# Patient Record
Sex: Female | Born: 1965
Health system: Southern US, Community
[De-identification: ages and names within clinical notes are randomized; demographics above are authoritative.]

## PROBLEM LIST (undated history)

## (undated) DIAGNOSIS — R350 Frequency of micturition: Secondary | ICD-10-CM

## (undated) DIAGNOSIS — N390 Urinary tract infection, site not specified: Secondary | ICD-10-CM

## (undated) DIAGNOSIS — R87629 Unspecified abnormal cytological findings in specimens from vagina: Secondary | ICD-10-CM

## (undated) DIAGNOSIS — J069 Acute upper respiratory infection, unspecified: Secondary | ICD-10-CM

## (undated) DIAGNOSIS — R319 Hematuria, unspecified: Secondary | ICD-10-CM

## (undated) HISTORY — DX: Unspecified abnormal cytological findings in specimens from vagina: R87.629

## (undated) HISTORY — PX: CERVIX LESION DESTRUCTION: SHX591

## (undated) HISTORY — PX: OTHER SURGICAL HISTORY: SHX169

## (undated) HISTORY — DX: Hematuria, unspecified: R31.9

## (undated) HISTORY — PX: TONSILLECTOMY: SUR1361

## (undated) HISTORY — DX: Frequency of micturition: R35.0

## (undated) HISTORY — DX: Acute upper respiratory infection, unspecified: J06.9

## (undated) HISTORY — DX: Urinary tract infection, site not specified: N39.0

---
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
## 2001-03-11 ENCOUNTER — Observation Stay (HOSPITAL_COMMUNITY): Admission: EM | Admit: 2001-03-11 | Discharge: 2001-03-12 | Payer: Self-pay | Admitting: Emergency Medicine

## 2003-01-25 HISTORY — PX: THORACOTOMY: SUR1349

## 2003-03-10 ENCOUNTER — Other Ambulatory Visit: Admission: RE | Admit: 2003-03-10 | Discharge: 2003-03-10 | Payer: Self-pay | Admitting: Obstetrics and Gynecology

## 2003-05-01 ENCOUNTER — Ambulatory Visit (HOSPITAL_COMMUNITY): Admission: RE | Admit: 2003-05-01 | Discharge: 2003-05-01 | Payer: Self-pay | Admitting: Family Medicine

## 2003-05-12 ENCOUNTER — Ambulatory Visit (HOSPITAL_COMMUNITY): Admission: RE | Admit: 2003-05-12 | Discharge: 2003-05-12 | Payer: Self-pay | Admitting: Family Medicine

## 2003-06-06 ENCOUNTER — Ambulatory Visit (HOSPITAL_COMMUNITY): Admission: RE | Admit: 2003-06-06 | Discharge: 2003-06-06 | Payer: Self-pay | Admitting: Family Medicine

## 2003-06-09 ENCOUNTER — Ambulatory Visit (HOSPITAL_COMMUNITY): Admission: RE | Admit: 2003-06-09 | Discharge: 2003-06-09 | Payer: Self-pay | Admitting: Family Medicine

## 2003-06-18 ENCOUNTER — Ambulatory Visit (HOSPITAL_COMMUNITY): Admission: RE | Admit: 2003-06-18 | Discharge: 2003-06-18 | Payer: Self-pay | Admitting: Family Medicine

## 2003-07-10 ENCOUNTER — Encounter (INDEPENDENT_AMBULATORY_CARE_PROVIDER_SITE_OTHER): Payer: Self-pay | Admitting: Specialist

## 2003-07-10 ENCOUNTER — Inpatient Hospital Stay (HOSPITAL_COMMUNITY): Admission: RE | Admit: 2003-07-10 | Discharge: 2003-07-14 | Payer: Self-pay | Admitting: Cardiothoracic Surgery

## 2003-07-31 ENCOUNTER — Encounter: Admission: RE | Admit: 2003-07-31 | Discharge: 2003-07-31 | Payer: Self-pay | Admitting: Cardiothoracic Surgery

## 2003-10-30 ENCOUNTER — Encounter: Admission: RE | Admit: 2003-10-30 | Discharge: 2003-10-30 | Payer: Self-pay | Admitting: Cardiothoracic Surgery

## 2004-08-06 ENCOUNTER — Ambulatory Visit (HOSPITAL_COMMUNITY): Admission: RE | Admit: 2004-08-06 | Discharge: 2004-08-06 | Payer: Self-pay | Admitting: Cardiothoracic Surgery

## 2006-01-10 ENCOUNTER — Ambulatory Visit (HOSPITAL_COMMUNITY): Admission: RE | Admit: 2006-01-10 | Discharge: 2006-01-10 | Payer: Self-pay | Admitting: Obstetrics & Gynecology

## 2006-03-07 ENCOUNTER — Ambulatory Visit (HOSPITAL_COMMUNITY): Admission: RE | Admit: 2006-03-07 | Discharge: 2006-03-07 | Payer: Self-pay | Admitting: Neurosurgery

## 2007-03-06 ENCOUNTER — Other Ambulatory Visit: Admission: RE | Admit: 2007-03-06 | Discharge: 2007-03-06 | Payer: Self-pay | Admitting: Obstetrics & Gynecology

## 2007-05-28 ENCOUNTER — Ambulatory Visit (HOSPITAL_COMMUNITY): Admission: RE | Admit: 2007-05-28 | Discharge: 2007-05-28 | Payer: Self-pay | Admitting: Obstetrics & Gynecology

## 2008-04-11 ENCOUNTER — Observation Stay (HOSPITAL_COMMUNITY): Admission: EM | Admit: 2008-04-11 | Discharge: 2008-04-11 | Payer: Self-pay | Admitting: Emergency Medicine

## 2008-07-07 ENCOUNTER — Other Ambulatory Visit: Admission: RE | Admit: 2008-07-07 | Discharge: 2008-07-07 | Payer: Self-pay | Admitting: Obstetrics & Gynecology

## 2009-07-16 ENCOUNTER — Other Ambulatory Visit: Admission: RE | Admit: 2009-07-16 | Discharge: 2009-07-16 | Payer: Self-pay | Admitting: Obstetrics & Gynecology

## 2009-09-08 ENCOUNTER — Ambulatory Visit (HOSPITAL_COMMUNITY): Admission: RE | Admit: 2009-09-08 | Discharge: 2009-09-08 | Payer: Self-pay | Admitting: Obstetrics & Gynecology

## 2010-05-06 LAB — DIFFERENTIAL
Basophils Absolute: 0 10*3/uL (ref 0.0–0.1)
Basophils Relative: 1 % (ref 0–1)
Eosinophils Absolute: 0 10*3/uL (ref 0.0–0.7)
Eosinophils Relative: 1 % (ref 0–5)
Lymphocytes Relative: 37 % (ref 12–46)
Lymphocytes Relative: 38 % (ref 12–46)
Lymphs Abs: 1.2 10*3/uL (ref 0.7–4.0)
Lymphs Abs: 1.6 10*3/uL (ref 0.7–4.0)
Monocytes Absolute: 0.4 10*3/uL (ref 0.1–1.0)
Monocytes Relative: 12 % (ref 3–12)
Neutro Abs: 1.6 10*3/uL — ABNORMAL LOW (ref 1.7–7.7)
Neutro Abs: 2.1 10*3/uL (ref 1.7–7.7)
Neutrophils Relative %: 48 % (ref 43–77)
Neutrophils Relative %: 50 % (ref 43–77)

## 2010-05-06 LAB — URINALYSIS, ROUTINE W REFLEX MICROSCOPIC
Glucose, UA: NEGATIVE mg/dL
Ketones, ur: NEGATIVE mg/dL
Leukocytes, UA: NEGATIVE
Nitrite: NEGATIVE
Protein, ur: 30 mg/dL — AB
Specific Gravity, Urine: >1.03 — ABNORMAL HIGH (ref 1.005–1.030)
Urobilinogen, UA: 0.2 mg/dL (ref 0.0–1.0)
pH: 5 (ref 5.0–8.0)

## 2010-05-06 LAB — COMPREHENSIVE METABOLIC PANEL
CO2: 17 mEq/L — ABNORMAL LOW (ref 19–32)
Calcium: 7.1 mg/dL — ABNORMAL LOW (ref 8.4–10.5)
Creatinine, Ser: 0.98 mg/dL (ref 0.4–1.2)
GFR calc non Af Amer: >60 mL/min (ref >60)
Glucose, Bld: 100 mg/dL — ABNORMAL HIGH (ref 70–99)

## 2010-05-06 LAB — CBC
HCT: 54.1 % — ABNORMAL HIGH (ref 36.0–46.0)
Hemoglobin: 12.9 g/dL (ref 12.0–15.0)
Hemoglobin: 18.7 g/dL — ABNORMAL HIGH (ref 12.0–15.0)
MCHC: 34.5 g/dL (ref 30.0–36.0)
MCHC: 35 g/dL (ref 30.0–36.0)
MCV: 92.8 fL (ref 78.0–100.0)
MCV: 93.1 fL (ref 78.0–100.0)
Platelets: 207 10*3/uL (ref 150–400)
RBC: 3.95 MIL/uL (ref 3.87–5.11)
RBC: 5.83 MIL/uL — ABNORMAL HIGH (ref 3.87–5.11)
RDW: 12.3 % (ref 11.5–15.5)
RDW: 12.6 % (ref 11.5–15.5)
WBC: 3.3 10*3/uL — ABNORMAL LOW (ref 4.0–10.5)

## 2010-05-06 LAB — STOOL CULTURE

## 2010-05-06 LAB — BASIC METABOLIC PANEL
BUN: 11 mg/dL (ref 6–23)
BUN: 2 mg/dL — ABNORMAL LOW (ref 6–23)
CO2: 19 mEq/L (ref 19–32)
Calcium: 10.3 mg/dL (ref 8.4–10.5)
Chloride: 110 mEq/L (ref 96–112)
Creatinine, Ser: 1.19 mg/dL (ref 0.4–1.2)
GFR calc Af Amer: >60 mL/min (ref >60)
GFR calc Af Amer: >60 mL/min (ref >60)
GFR calc non Af Amer: 50 mL/min — ABNORMAL LOW (ref >60)
GFR calc non Af Amer: >60 mL/min (ref >60)
Glucose, Bld: 133 mg/dL — ABNORMAL HIGH (ref 70–99)
Potassium: 3.3 mEq/L — ABNORMAL LOW (ref 3.5–5.1)
Potassium: 3.5 mEq/L (ref 3.5–5.1)
Sodium: 141 mEq/L (ref 135–145)
Sodium: 141 mEq/L (ref 135–145)

## 2010-05-06 LAB — PHOSPHORUS: Phosphorus: 2.1 mg/dL — ABNORMAL LOW (ref 2.3–4.6)

## 2010-05-06 LAB — LIPASE, BLOOD: Lipase: 30 U/L (ref 11–59)

## 2010-05-06 LAB — MAGNESIUM: Magnesium: 1.6 mg/dL (ref 1.5–2.5)

## 2010-05-06 LAB — HEPATIC FUNCTION PANEL
ALT: 27 U/L (ref 0–35)
Albumin: 4.7 g/dL (ref 3.5–5.2)
Indirect Bilirubin: 0.4 mg/dL (ref 0.3–0.9)
Total Protein: 8.5 g/dL — ABNORMAL HIGH (ref 6.0–8.3)

## 2010-05-06 LAB — URINE MICROSCOPIC-ADD ON

## 2010-05-06 LAB — PREGNANCY, URINE: Preg Test, Ur: NEGATIVE

## 2010-05-06 LAB — OVA AND PARASITE EXAMINATION

## 2010-06-08 NOTE — H&P (Signed)
NAMEMarland Kitchen  KALIYAN, OSBOURN NO.:  [id number]   MEDICAL RECORD NO.:  [id number]          PATIENT TYPE:  EMS   LOCATION:  ED                            FACILITY:  [facility]   PHYSICIAN:  Dorris Singh, DO    DATE OF BIRTH:  09-21-1965   DATE OF ADMISSION:  04/10/2008  DATE OF DISCHARGE:  LH                              HISTORY & PHYSICAL   The patient is a 45 year old Caucasian female who presented to the [facility] with 4 days of diarrhea and nausea and vomiting.  She stated that on Sunday she went out with her parents, did not eat  anything unusual, but feels that she got a virus from that outing.  When  she came home on Monday she had profuse diarrhea and had several bowel  movements during the day.  The stool is now just water as it is  currently.  She says for 4 days she has not been able to hydrate herself  and she is losing more fluids than she is intaking.  She came here to be  evaluated.  She says that she is feeling weak, and has weakness with  exertion, and shortness of breath with exertion as well.  Her stools are  characterized as watery and the symptoms are moderate, is aggravated by  nothing and is relieved by nothing.  She has not been able to eat and  she also feels that her bowels are hyperactive.  The patient denies  being around any sick contacts.  Her primary care physician is Dr.  Milinda Cave.   ALLERGIES:  No known drug allergies.   She is currently on __________ PSE oral for contraception.   PAST SURGICAL HISTORY:  She has had a thoracotomy for a cystic rib that  she had removed.   PAST MEDICAL HISTORY:  She denies any issue.   SOCIAL HISTORY:  She is nonsmoker, nondrinker.  She is not married but  she currently is a Engineer, civil (consulting).   REVIEW OF SYSTEMS:  CONSTITUTIONAL:  Positive for appetite change.  EYES:  Negative for changes in vision.  EARS, NOSE, MOUTH AND THROAT:  Negative for sore throat or changes in hearing.  CARDIOVASCULAR:  Negative for palpitations.  Positive for dyspnea on exertion.  RESPIRATORY:  Negative for cough and wheezing, positive for dyspnea on  exertion.  GASTROINTESTINAL:  Positive for nausea, vomiting, diarrhea,  abdominal pain and cramps,  no blood in stool.  GU:  Negative for  dysuria, urgency.  MUSCULOSKELETAL:  Negative for arthralgias or  myalgias.  SKIN:  Negative for rash.  NEURO:  Negative for headache or  confusion.   PHYSICAL EXAMINATION:  Blood pressure is 148/76, pulse rate is 99,  respirations are 28.  GENERAL:  The patient is a 45 year old woman who is well-developed, well-  nourished, in no acute distress.  HEAD:  Normocephalic, atraumatic.  Eyes are PERRLA.  EOMI.  Teeth are in  fair repair.  Nose:  Turbinates are moist.  NECK:  Supple.  No lymphadenopathy.  HEART:  Tachy sinus.  No murmurs noted.  LUNGS:  Clear to auscultation bilaterally.  ABDOMEN:  Soft, nontender, with hyperactive bowel sounds in all 4  quadrants EXTREMITIES:  Positive pulses.  No edema, ecchymosis or  cyanosis.  NEURO:  Cranial nerves II-XII are grossly intact.  She is A and O x3.  SKIN:  Positive tenting and dry, mucous membranes are moist.   LABORATORIES:  Urine pregnancy is negative.  Her UA shows few bacteria,  negative for any casting, negative for urine nitrates or leukocyte  esterase.  Her lipase is 30.  Her hepatic function panel is within  normal limits except for total protein being 8.5.  Two-view chest shows  chronic emphysematous change without acute infiltrate.  Sodium is 141,  potassium 3.3, chloride 110, carbon dioxide 19, glucose 133, BUN 11,  creatinine 1.19.  White count 3.3, hemoglobin 18.7, hematocrit 54.1 and  a platelet count of 207.   ASSESSMENT AND PLAN:  1. Acute gastroenteritis.  2. Diarrhea.  3. Dehydration.  4. Shortness of breath.   PLAN:  Will be to admit the patient to the service of [facility] and  observation.  Will put a bedside commode for her and do strict I's  and  O's as well as keeping her saturations above 90% using nasal cannula,  Venti non-rebreather.  Also will place her on a clear liquid diet and  increase as tolerated to a full liquid and then regular.  Will start her  on normal saline at 250 mL/hour with 2 fluid boluses in between for this  evening.  Will also get stool cultures and check for ova and parasites,  and will also monitor her blood work in the morning.  Will place her on  DVT and GI prophylaxis.  Will have her take her birth control pills from  home.  We will also put her on nebulizer p.r.n. since we did see some  emphysema changes on her chest x-ray, and she is complaining of  shortness of breath.  At this point in time, I do not feel that we need  to consult GI, but if patient continues to have these symptoms in spite  of some of the palliative measures we have instituted, we will consider  a GI consult if needed.  Will continue to monitor and change therapy as  necessary.      Dorris Singh, DO  Electronically Signed     CB/MEDQ  D:  04/11/2008  T:  04/11/2008  Job:  [id number]

## 2010-06-08 NOTE — Discharge Summary (Signed)
[name], [name]                  ACCOUNT NO.:  [id number]   MEDICAL RECORD NO.:  [id number]          PATIENT TYPE:  OBV   LOCATION:  A311                          FACILITY:  [facility]   PHYSICIAN:  Osvaldo Shipper, MD     DATE OF BIRTH:  27-Jan-1965   DATE OF ADMISSION:  DATE OF DISCHARGE:  03/19/2010LH                               DISCHARGE SUMMARY   The patient's PMD is Dr. Milinda Cave.  Please review H and P dictated by Dr.  Elige Radon for details regarding the patient's presenting illness.   DISCHARGE DIAGNOSES:  1. Acute gastroenteritis, improved.  2. Hypokalemia, improved.   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  This  was followed by diarrhea.  Denied any abdominal pain.  She presented to  the ED where she underwent a chest x-ray, which was negative for any  acute infiltrate.  The patient underwent blood work, which revealed some  mild degree of hemoconcentration and she was also noted to be  hypokalemic.  The patient was managed in the ED but did not improve and  so was admitted for further evaluation and management.  The patient was  admitted to the hospital and was started on IV fluids.  Potassium was  replaced.  Imodium was given to the patient and the patient quickly  improved, and this afternoon she was keen on going back home.  We  rechecked a potassium level and came back at 3.5.  Since her symptoms  have improved and since she is very keen on going back home, we felt it  was okay to discharge her   DISCHARGE MEDICATIONS:  1. Potassium chloride 40 mEq daily for 5 days.  2. Magnesium oxide 400 mg 3 times  daily for 5 days.  3. Imodium over the counter as needed for diarrhea, maximum of 4 pills      a day.   Follow up with PMD in 1-2 weeks.   Diet, full liquids initially and then advance diet as  tolerated.  Physical activity as before.   Total time on this encounter greater than 30 minutes.      Osvaldo Shipper, MD  Electronically Signed     GK/MEDQ  D:  04/11/2008  T:  04/12/2008  Job:  [id number]   cc:   Jeoffrey Massed, MD  Fax: [phone]

## 2010-06-11 NOTE — Op Note (Signed)
NAME:  Shelby [name], Shelby [name]                            ACCOUNT NO.:  [id number]   MEDICAL RECORD NO.:  [id number]                   PATIENT TYPE:  INP   LOCATION:  3309                                 FACILITY:  [facility]   PHYSICIAN:  Gwenith Daily. Tyrone Sage, M.D.            DATE OF BIRTH:  [birth year]   DATE OF PROCEDURE:  07/10/2003  DATE OF DISCHARGE:  07/14/2003                                 OPERATIVE REPORT   PREOPERATIVE DIAGNOSIS:  Right ninth rib lesion.   POSTOPERATIVE DIAGNOSIS:  Right ninth rib lesion.   PROCEDURE:  Right thoracotomy and resection of right ninth rib in  conjunction with neurosurgery.   SURGEON:  Gwenith Daily. Tyrone Sage, M.D.   ASSISTANT:  Eber Jones A. Eustaquio Boyden.   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  A chest x-ray  revealed a posterior mediastinal mass.  Further evaluation with CT scan  revealed a cystic appearing ninth posterior rib lesion extending into the  vertebral body but without spinal cord compression or involvement.  Resection of the rib is recommended by both me and neurosurgery service.  It  was planned to do the resection of the rib and have the assistance of  neurosurgery to resect the involved vertebral body.  The procedure was  discussed in detail with the patient who agreed and signed informed consent.   DESCRIPTION OF PROCEDURE:  With arterial line and double lumen endotracheal  in place, the patient was turned into the lateral decubitus position with  the right side up.  An incision was made in the seventh intercostal space  and the chest entered.  This gave good visualization of an enlarged ninth  rib which was pushing on the posterior ribs above and below it but without  direct invasion.  Distally, the ninth rib was divided and then the rib with  overlying soft tissue was rotated medially and dissected down to the  transverse process.  With this  process, the middle portion of the thinned  out bony rib that was cystic in nature, broke.  The remainder of the rib was  resected down to the transverse processes.  The remainder of the involvement  into the vertebral bodies was dictated under a separate note by  neurosurgery, Dr. Venetia Maxon.  With the portion of the vertebral body resected  after a bone graft had been placed, the intercostal sutures were placed and  the ribs reapproximated.  The chest wall was closed in layers with  interrupted 0 Vicryl sutures, 2-0 subcutaneous tissue and a 3-0 subcuticular  stitch were applied.  Dry dressings were applied.  A single 28 chest tube  was left in place.  The patient tolerated the procedure without obvious  complications, was extubated in the operating room with full lower extremity  function, and was transferred to the recovery room.  Gwenith Daily Tyrone Sage, M.D.    Tyson Babinski  D:  07/14/2003  T:  07/14/2003  Job:  [id number]

## 2010-06-11 NOTE — Discharge Summary (Signed)
NAMEMarland Kitchen  VYLET, MAFFIA NO.:  [id number]   MEDICAL RECORD NO.:  [id number]          PATIENT TYPE:  INP   LOCATION:  3309                         FACILITY:  [facility]   PHYSICIAN:  Sheliah Plane, MD    DATE OF BIRTH:  [birth year]   DATE OF ADMISSION:  07/10/2003  DATE OF DISCHARGE:  07/14/2003                                 DISCHARGE SUMMARY   ADMITTING DIAGNOSIS:  Right T9 rib lesion extending into the T9 vertebral  body.   PAST MEDICAL HISTORY AND DISCHARGE DIAGNOSES:  1.  Surgical wound closure of the perineum after injury secondary to fall in      February 2003.  2.  Tonsillectomy and adenoidectomy in 1969.  3.  Surgery for ptosis of the right eye in 1969.  4.  Right T9 rib lesion extending into the T9 vertebral body, status post      right thoracotomy and resection of calcific cystic thoracic mass at the      ninth rib with resection of the ninth rib portion and partial      vertebrectomy for full resection.   ALLERGIES:  No known drug allergies.   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  Secondary to an abnormality found on that chest x-ray, further  evaluation including a bone scan and MRI led to evaluation by Dr. Danae Orleans.  Venetia Maxon for an expanding lesion of the right 9th rib which extended into the  T9 vertebral body.  This possibly represented a giant cell tumor, or  aneurysmal bone cyst.  Bone scan was hot in the area of T9.  The patient's  only symptoms were minor back discomfort.  She denied any significant pain.  The patient initially saw Dr. Venetia Maxon and he then referred the patient to Dr.  Sheliah Plane in order to consult him to aid with the surgery.   HOSPITAL COURSE:  The patient was admitted and taken to the OR on July 10, 2003 for a right  thoracotomy and resection of calcific/cystic thoracic mass  at the 9th rib with resection of the 9th rib portion and partial  vertebrectomy for full resection.  Dr. Tyrone Sage and Dr. Venetia Maxon worked  together during the procedure.  The patient tolerated the procedure well and  was hemodynamically stable immediately postoperatively.  The patient was  transferred from the OR to the postanesthesia care unit and then  subsequently to unit 2300 without complication.  The patient was extubated  without complication and awoke from anesthesia neurologically intact.   The patient's postoperative course progressed as expected.  It was  uncomplicated and she remained afebrile with stable vital signs throughout  the postoperative course.  The patient's chest x-rays remained stable as  well.  The patient's invasive lines and On-Q were discontinued on  postoperative day 1 and postoperative day 2, accordingly.  The patient was  transferred from units 2300 to unit 3300, again without complication.  The  patient's chest tubes were discontinued in routine fashion.  On  postoperative day 4, the patient was feeling well with no nausea, vomiting,  shortness of breath or chest pain.  She was afebrile and vital signs were  stable with a blood pressure of 120/50, heart rate of 80 and O2 saturation  of 92% to 94% on room air.  Lungs were clear on exam, and the wound was  clean, dry and intact, and healing well.  The patient was in stable  condition and ready for discharge at that time.  The patient was ambulating  well, independently.   LABORATORIES:  BNP on July 12, 2003:  Sodium 137, potassium 3.5, BUN 3,  creatinine 0.9, glucose 127.  Hematocrit on July 11, 2003 -- 31.5.   CONDITION ON DISCHARGE:  Condition on discharge is improved.   INSTRUCTIONS:  1.  Medications:      1.  The patient was to resume or Ortho Tri-Cyclen as previously dosed.      2.  Colace 100 mg 2 tabs daily p.r.n. constipation.      3.   Ultram 50 mg 1-2 p.o. q.4-6 h. p.r.n. pain.  2.  Activity:  No driving or strenuous activity.  3.  Diet:  Same as preoperative.  4.  Wound care:  The patient should shower daily and clean the incisions      with soap and water.   SPECIAL INSTRUCTIONS:  If the incision becomes red, swollen or drains, or if  the patient experiences a fever of 101 degrees Fahrenheit, she is to contact  the [facility] office at [phone].   FOLLOWUP:  1.  Followup appointment with Dr. Tyrone Sage; [facility] was to contact the patient      with that appointment, date and time.  2.  [facility]:  The patient should report there 1 hour      before the appointment with Dr. Tyrone Sage for chest x-ray.  3.  Dr. Venetia Maxon:  The patient was instructed to call his office for an      appointment at [phone].      Aman   AY/MEDQ  D:  12/04/2003  T:  12/05/2003  Job:  [id number]   cc:   Sheliah Plane, MD  [address]  [place]  [place] [address]   Danae Orleans. Venetia Maxon, M.D.  [address]  [place]  [place] [address]  Fax: [phone]

## 2010-06-11 NOTE — Op Note (Signed)
NAME:  Shelby [name], Shelby [name]                            ACCOUNT NO.:  [id number]   MEDICAL RECORD NO.:  [id number]                   PATIENT TYPE:  INP   LOCATION:  2315                                 FACILITY:  [facility]   PHYSICIAN:  Danae Orleans. Venetia Maxon, M.D.               DATE OF BIRTH:  30-Jul-1965   DATE OF PROCEDURE:  07/10/2003  DATE OF DISCHARGE:                                 OPERATIVE REPORT   PREOPERATIVE DIAGNOSIS:  Right ninth rib mass lesion extending into  vertebral body.   POSTOPERATIVE DIAGNOSIS:  Right ninth rib mass lesion extending into  vertebral body.   PROCEDURE:  Right thoracotomy with resection of ninth rib lesion and  vertebral body lesion.   SURGEONS:  Danae Orleans. Venetia Maxon, M.D.  Gwenith Daily Tyrone Sage, M.D.   ANESTHESIA:  General endotracheal anesthesia.   ESTIMATED BLOOD LOSS:  Approximately 250 mL.   COMPLICATIONS:  None.   DISPOSITION:  Recovery room.   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  This is being done by Dr. Tyrone Sage will dictate the thoracotomy  and resection of the rib lesion along with closure.   DESCRIPTION OF PROCEDURE:  After Dr. Tyrone Sage had opened the chest and  removed the ninth rib lesion, I proceeded to remove the aspect of the lesion  which extended into the vertebral body.  The loose bone was unroofed using a  variety of curets and this extended into the vertebral body where there was  some friable soft tissue which appeared to be cyst wall lining and this was  carefully removed.  This was done under loupe magnification.  This was done  using high speed drill and also a variety of curets.  The vertebral aspect  appeared to have a smooth scalloped cortical wall after removing what  appeared to be a cyst lining.  This was not terribly vascular.   The edges of  bone were scraped.  This extended along the right pedicle and after drilling  down the bone and removing any vestiges of this soft tissue, the pedicle was  extremely thin and there was a breech in the cortex but the spinal canal was  not entered.  The cephalad extent of this mass was also removed.  The eighth  right thoracic nerve was cut and cauterized along with the segmental vessels  at this level.  After the vertebral body tumor appeared to be fully removed  and all soft tissue associated with this was removed, the wound was then  copiously irrigated with saline.  Hemostasis was assured with Gelfoam.  An  Allomatrix bone graft substitute was mixed.  5 mL of this was mixed and this  was placed  in the bony defect and tapped into position.  It was felt that the vertebral  body was not sufficiently destabilized to warrant the application of  instrumentation.  At this point, the closure of the surgical site was  performed by Dr. Tyrone Sage and will also be dictated separately by him.                                               Danae Orleans. Venetia Maxon, M.D.    JDS/MEDQ  D:  07/10/2003  T:  07/10/2003  Job:  [id number]

## 2010-07-26 ENCOUNTER — Other Ambulatory Visit (HOSPITAL_COMMUNITY)
Admission: RE | Admit: 2010-07-26 | Discharge: 2010-07-26 | Disposition: A | Discharge status: Home / Self Care | Payer: 59 | Source: Ambulatory Visit | Attending: Obstetrics & Gynecology | Admitting: Obstetrics & Gynecology

## 2010-07-26 ENCOUNTER — Other Ambulatory Visit: Payer: Self-pay | Admitting: Obstetrics & Gynecology

## 2010-07-26 DIAGNOSIS — Z01419 Encounter for gynecological examination (general) (routine) without abnormal findings: Secondary | ICD-10-CM | POA: Insufficient documentation

## 2014-03-04 ENCOUNTER — Other Ambulatory Visit: Payer: Self-pay | Admitting: Obstetrics & Gynecology

## 2014-03-04 DIAGNOSIS — Z1231 Encounter for screening mammogram for malignant neoplasm of breast: Secondary | ICD-10-CM

## 2014-03-10 ENCOUNTER — Ambulatory Visit (HOSPITAL_COMMUNITY)
Admission: RE | Admit: 2014-03-10 | Discharge: 2014-03-10 | Disposition: A | Discharge status: Home / Self Care | Payer: 59 | Source: Ambulatory Visit | Attending: Obstetrics & Gynecology | Admitting: Obstetrics & Gynecology

## 2014-03-10 DIAGNOSIS — Z1231 Encounter for screening mammogram for malignant neoplasm of breast: Secondary | ICD-10-CM | POA: Diagnosis present

## 2014-03-11 ENCOUNTER — Encounter: Payer: Self-pay | Admitting: Adult Health

## 2014-03-11 ENCOUNTER — Ambulatory Visit (INDEPENDENT_AMBULATORY_CARE_PROVIDER_SITE_OTHER): Payer: 59 | Admitting: Adult Health

## 2014-03-11 ENCOUNTER — Other Ambulatory Visit (HOSPITAL_COMMUNITY)
Admission: RE | Admit: 2014-03-11 | Discharge: 2014-03-11 | Disposition: A | Discharge status: Home / Self Care | Payer: 59 | Source: Ambulatory Visit | Attending: Adult Health | Admitting: Adult Health

## 2014-03-11 VITALS — BP 110/68 | HR 76 | Ht 64.0 in | Wt 160.0 lb

## 2014-03-11 DIAGNOSIS — Z01419 Encounter for gynecological examination (general) (routine) without abnormal findings: Secondary | ICD-10-CM | POA: Diagnosis present

## 2014-03-11 DIAGNOSIS — Z1212 Encounter for screening for malignant neoplasm of rectum: Secondary | ICD-10-CM

## 2014-03-11 DIAGNOSIS — Z1151 Encounter for screening for human papillomavirus (HPV): Secondary | ICD-10-CM | POA: Diagnosis present

## 2014-03-11 LAB — HEMOCCULT GUIAC POC 1CARD (OFFICE): FECAL OCCULT BLD: NEGATIVE

## 2014-03-11 NOTE — Patient Instructions (Signed)
Physical in 1 year Mammogram yearly Labs at work Colonoscopy at 50

## 2014-03-11 NOTE — Progress Notes (Signed)
Patient ID: Shelby [name], female   DOB: 08/18/1965, 49 y.o.   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  Current Medications, Allergies, Past Medical History, Past Surgical History, Family History and Social History were reviewed in [facility] electronic medical record.     Review of Systems: Patient denies any headaches, hearing loss, fatigue, blurred vision, shortness of breath, chest pain, abdominal pain, problems with bowel movements, urination, or intercourse. No joint pain or mood swings.    Physical Exam:BP 110/68 mmHg  Pulse 76  Ht 5\' 4"  (1.626 m)  Wt 160 lb (72.576 kg)  BMI 27.45 kg/m2She is PM. General:  Well developed, well nourished, no acute distress Skin:  Warm and dry Neck:  Midline trachea, normal thyroid, good ROM, no lymphadenopathy Lungs; Clear to auscultation bilaterally Breast: Deferred, had mammogram yesterday  Cardiovascular: Regular rate and rhythm Abdomen:  Soft, non tender, no hepatosplenomegaly Pelvic:  External genitalia is normal in appearance, no lesions.  The vagina has decreased color, moisture and rugae. Urethra has no lesions or masses. The cervix is smooth and stenotic at os.  Uterus is felt to be normal size, shape, and contour.  No adnexal masses or tenderness noted.Bladder is non tender, no masses felt. Rectal: Good sphincter tone, no polyps, or hemorrhoids felt.  Hemoccult negative. Extremities/musculoskeletal:  No swelling or varicosities noted, no clubbing or cyanosis Psych:  No mood changes, alert and cooperative,seems happy   Impression:  Well woman gyn exam with pap   Plan: Physical in 1 year Pap in 3 if negative HPV Mammogram yearly  Labs at work Colonoscopy at 50

## 2014-03-13 LAB — CYTOLOGY - PAP

## 2014-09-16 ENCOUNTER — Encounter: Payer: Self-pay | Admitting: Adult Health

## 2014-09-16 ENCOUNTER — Ambulatory Visit (INDEPENDENT_AMBULATORY_CARE_PROVIDER_SITE_OTHER): Payer: 59 | Admitting: Adult Health

## 2014-09-16 VITALS — BP 102/62 | HR 100 | Temp 98.3°F | Ht 63.5 in | Wt 167.0 lb

## 2014-09-16 DIAGNOSIS — R350 Frequency of micturition: Secondary | ICD-10-CM | POA: Diagnosis not present

## 2014-09-16 DIAGNOSIS — N390 Urinary tract infection, site not specified: Secondary | ICD-10-CM

## 2014-09-16 DIAGNOSIS — R319 Hematuria, unspecified: Secondary | ICD-10-CM | POA: Diagnosis not present

## 2014-09-16 HISTORY — DX: Frequency of micturition: R35.0

## 2014-09-16 HISTORY — DX: Hematuria, unspecified: R31.9

## 2014-09-16 HISTORY — DX: Urinary tract infection, site not specified: N39.0

## 2014-09-16 LAB — POCT URINALYSIS DIPSTICK
Glucose, UA: NEGATIVE
Leukocytes, UA: NEGATIVE
Nitrite, UA: POSITIVE
PROTEIN UA: NEGATIVE

## 2014-09-16 MED ORDER — PHENAZOPYRIDINE HCL 200 MG PO TABS: 200.0000 mg | ORAL_TABLET | Freq: Three times a day (TID) | ORAL | Status: DC | PRN

## 2014-09-16 MED ORDER — SULFAMETHOXAZOLE-TRIMETHOPRIM 800-160 MG PO TABS: 1.0000 | ORAL_TABLET | Freq: Two times a day (BID) | ORAL | Status: DC

## 2014-09-16 NOTE — Patient Instructions (Signed)
Urinary Tract Infection Urinary tract infections (UTIs) can develop anywhere along your urinary tract. Your urinary tract is your body's drainage system for removing wastes and extra water. Your urinary tract includes two kidneys, two ureters, a bladder, and a urethra. Your kidneys are a pair of bean-shaped organs. Each kidney is about the size of your fist. They are located below your ribs, one on each side of your spine. CAUSES Infections are caused by microbes, which are microscopic organisms, including fungi, viruses, and bacteria. These organisms are so small that they can only be seen through a microscope. Bacteria are the microbes that most commonly cause UTIs. SYMPTOMS  Symptoms of UTIs may vary by age and gender of the patient and by the location of the infection. Symptoms in young women typically include a frequent and intense urge to urinate and a painful, burning feeling in the bladder or urethra during urination. Older women and men are more likely to be tired, shaky, and weak and have muscle aches and abdominal pain. A fever may mean the infection is in your kidneys. Other symptoms of a kidney infection include pain in your back or sides below the ribs, nausea, and vomiting. DIAGNOSIS To diagnose a UTI, your caregiver will ask you about your symptoms. Your caregiver also will ask to provide a urine sample. The urine sample will be tested for bacteria and white blood cells. White blood cells are made by your body to help fight infection. TREATMENT  Typically, UTIs can be treated with medication. Because most UTIs are caused by a bacterial infection, they usually can be treated with the use of antibiotics. The choice of antibiotic and length of treatment depend on your symptoms and the type of bacteria causing your infection. HOME CARE INSTRUCTIONS  If you were prescribed antibiotics, take them exactly as your caregiver instructs you. Finish the medication even if you feel better after you  have only taken some of the medication.  Drink enough water and fluids to keep your urine clear or pale yellow.  Avoid caffeine, tea, and carbonated beverages. They tend to irritate your bladder.  Empty your bladder often. Avoid holding urine for long periods of time.  Empty your bladder before and after sexual intercourse.  After a bowel movement, women should cleanse from front to back. Use each tissue only once. SEEK MEDICAL CARE IF:   You have back pain.  You develop a fever.  Your symptoms do not begin to resolve within 3 days. SEEK IMMEDIATE MEDICAL CARE IF:   You have severe back pain or lower abdominal pain.  You develop chills.  You have nausea or vomiting.  You have continued burning or discomfort with urination. MAKE SURE YOU:   Understand these instructions.  Will watch your condition.  Will get help right away if you are not doing well or get worse. Document Released: 10/20/2004 Document Revised: 07/12/2011 Document Reviewed: 02/18/2011 Exit[employer] Patient Information 2015 [employer]. This information is not intended to replace advice given to you by your health care provider. Make sure you discuss any questions you have with your health care provider. Push fluids Follow up prn 

## 2014-09-16 NOTE — Progress Notes (Signed)
Subjective:     Patient ID: Shelby [name], female   DOB: 1965-08-15, 49 y.o.   MRN: [id number]  HPI Shelby [name] is a 49 year old white female, married in complaining of urinary frequency and had chills last night.She actually saw frank blood in urine.  Review of Systems Patient denies any headaches, hearing loss, fatigue, blurred vision, shortness of breath, chest pain, abdominal pain, problems with bowel movements,  or intercourse. No joint pain or mood swings.See HPI for positives.  Reviewed past medical,surgical, social and family history. Reviewed medications and allergies.     Objective:   Physical Exam BP 102/62 mmHg  Pulse 100  Temp(Src) 98.3 F (36.8 C)  Ht 5' 3.5" (1.613 m)  Wt 167 lb (75.751 kg)  BMI 29.12 kg/m2 urine +nitrates and 3+blood, Skin warm and dry,  No CVAT    Assessment:     UTI Hematuria Urinary frequency     Plan:    UA C&S sent Rx septra ds 1 bid x 7 days #14 no refills Rx pyridium 200 mg 1 tid #10 no refills   Push fluids Review handout on UTI

## 2014-09-17 LAB — MICROSCOPIC EXAMINATION
CASTS: NONE SEEN /LPF
RBC, UA: >30 /hpf — AB (ref 0–?)
WBC, UA: >30 /hpf — AB (ref 0–?)

## 2014-09-17 LAB — URINALYSIS, ROUTINE W REFLEX MICROSCOPIC
BILIRUBIN UA: NEGATIVE
GLUCOSE, UA: NEGATIVE
Nitrite, UA: POSITIVE — AB
Specific Gravity, UA: 1.028 (ref 1.005–1.030)
UUROB: 1 mg/dL (ref 0.2–1.0)
pH, UA: 5.5 (ref 5.0–7.5)

## 2014-09-18 LAB — URINE CULTURE

## 2015-02-05 DIAGNOSIS — R6882 Decreased libido: Secondary | ICD-10-CM | POA: Diagnosis not present

## 2015-02-05 DIAGNOSIS — E559 Vitamin D deficiency, unspecified: Secondary | ICD-10-CM | POA: Diagnosis not present

## 2015-02-05 DIAGNOSIS — R739 Hyperglycemia, unspecified: Secondary | ICD-10-CM | POA: Diagnosis not present

## 2015-02-05 DIAGNOSIS — N959 Unspecified menopausal and perimenopausal disorder: Secondary | ICD-10-CM | POA: Diagnosis not present

## 2015-02-11 ENCOUNTER — Telehealth: Payer: Self-pay | Admitting: Adult Health

## 2015-02-11 DIAGNOSIS — N950 Postmenopausal bleeding: Secondary | ICD-10-CM | POA: Diagnosis not present

## 2015-02-11 NOTE — Telephone Encounter (Signed)
Pt has not had a period in 3-4 years and started bleeding bright red,made appt for 1/23, is nervous about it, will check [facility] today and talk in am when results back and then order [place] to assess endometrial lining.

## 2015-02-12 ENCOUNTER — Telehealth: Payer: Self-pay | Admitting: Adult Health

## 2015-02-12 ENCOUNTER — Other Ambulatory Visit: Payer: Self-pay | Admitting: Adult Health

## 2015-02-12 ENCOUNTER — Other Ambulatory Visit (INDEPENDENT_AMBULATORY_CARE_PROVIDER_SITE_OTHER): Payer: 59

## 2015-02-12 DIAGNOSIS — N950 Postmenopausal bleeding: Secondary | ICD-10-CM

## 2015-02-12 LAB — FOLLICLE STIMULATING HORMONE: FSH: 147 m[IU]/mL

## 2015-02-12 NOTE — Progress Notes (Signed)
PELVIC US TA/TV: heterogenous anteverted uterus w/ a subserosal fundal fibroid  1.9 x 1.5 x 1.9cm,irregular complex thickened endometrium w/mult cystic areas 8.18mm,normal ov's bilat,no free fluid seen,no pain during ultrasound.

## 2015-02-12 NOTE — Telephone Encounter (Signed)
Shelby [name] had [place] this am, endometrial lining 8.2 mm, will get endo biopsy Monday, handout given, as I spoke with her after [place]

## 2015-02-12 NOTE — Telephone Encounter (Signed)
Left message FSH 147 ,will get [place] asap

## 2015-02-16 ENCOUNTER — Encounter: Payer: Self-pay | Admitting: Obstetrics & Gynecology

## 2015-02-16 ENCOUNTER — Ambulatory Visit: Payer: 59 | Admitting: Adult Health

## 2015-02-16 ENCOUNTER — Other Ambulatory Visit: Payer: Self-pay | Admitting: Obstetrics & Gynecology

## 2015-02-16 ENCOUNTER — Ambulatory Visit (INDEPENDENT_AMBULATORY_CARE_PROVIDER_SITE_OTHER): Payer: 59 | Admitting: Obstetrics & Gynecology

## 2015-02-16 VITALS — BP 120/80 | HR 72 | Wt 170.4 lb

## 2015-02-16 DIAGNOSIS — N950 Postmenopausal bleeding: Secondary | ICD-10-CM | POA: Diagnosis not present

## 2015-02-16 DIAGNOSIS — R938 Abnormal findings on diagnostic imaging of other specified body structures: Secondary | ICD-10-CM | POA: Diagnosis not present

## 2015-02-16 DIAGNOSIS — N858 Other specified noninflammatory disorders of uterus: Secondary | ICD-10-CM | POA: Diagnosis not present

## 2015-02-16 DIAGNOSIS — R9389 Abnormal findings on diagnostic imaging of other specified body structures: Secondary | ICD-10-CM

## 2015-02-16 NOTE — Progress Notes (Signed)
Patient ID: Shelby [name], female   DOB: 10/29/1965, 50 y.o.   MRN: [id number] Recent Results (from the past 2160 hour(s))  Follicle stimulating hormone     Status: None   Collection Time: 02/11/15  3:44 PM  Result Value Ref Range   FSH 147.0 mIU/mL    Comment:                     Adult Female:                       Follicular phase      3.5 -  12.5                       Ovulation phase       4.7 -  21.5                       Luteal phase          1.7 -   7.7                       Postmenopausal       25.8 - 134.8    GYNECOLOGIC SONOGRAM   [name] is a 50 y.o. G1P0010 postmenopausal women. She is here for a pelvic sonogram for postmenopausal bleeding .  Uterus 5.6 x 3.9 x 4.9 cm, heterogenous anteverted uterus w/ a subserosal fundal fibroid 1.9 x 1.5 x 1.9cm  Endometrium 8.2 mm, irregular, complex thickened endometrium w/mult cystic areas 8.88mm,w/color flow  Right ovary 2.1 x 1.2 x 1.6 cm, wnl  Left ovary 1.7 x 1.1 x 1.6 cm, wnl   Technician Comments:  PELVIC US TA/TV: heterogenous anteverted uterus w/ a subserosal fundal fibroid 1.9 x 1.5 x 1.9cm,irregular, complex thickened endometrium w/mult cystic areas 8.62mm,normal ov's bilat,no free fluid seen,no pain during ultrasound. Pt was seen by Victorino Dike after ultrasound.    [name] 02/12/2015 11:53 AM  Clinical Impression and recommendations:  I have reviewed the sonogram results above, combined with the patient's current clinical course, below are my impressions and any appropriate recommendations for management based on the sonographic findings.  Thickened endometrium, complex, in a postmenopausal woman Normal uterus small clinically insignificnt myoma Normal ovaries consistent with post menopausal state  Recommend endometrial sampling   [name] 02/16/2015 3:06 PM  Endometrial Biopsy Procedure Note  Pre-operative Diagnosis: post menopausal  bleeding with thickened complex endometrium   Post-operative Diagnosis: same  Indications: postmenopausal bleeding  Procedure Details   Urine pregnancy test was not done.  The risks (including infection, bleeding, pain, and uterine perforation) and benefits of the procedure were explained to the patient and Written informed consent was obtained.  Antibiotic prophylaxis against endocarditis was not indicated.   The patient was placed in the dorsal lithotomy position.  Bimanual exam showed the uterus to be in the neutral position.  A Graves' speculum inserted in the vagina, and the cervix prepped with povidone iodine.  Endocervical curettage with a Kevorkian curette was not performed.   A sharp tenaculum was applied to the anterior lip of the cervix for stabilization.  A sterile uterine sound was used to sound the uterus to a depth of 6.5cm.  A Mylex 3mm curette was used to sample the endometrium.  Sample was sent for pathologic examination.  Condition: Stable  Complications: None  Plan:  The patient was advised to call for any  fever or for prolonged or severe pain or bleeding. She was advised to use OTC ibuprofen as needed for mild to moderate pain. She was advised to avoid vaginal intercourse for 48 hours or until the bleeding has completely stopped.  Attending Physician Documentation: I was present for or performed the following: endometrial biopsy

## 2015-02-16 NOTE — Addendum Note (Signed)
Addended by: Federico Flake A on: 02/16/2015 03:29 PM   Modules accepted: Orders

## 2015-02-18 ENCOUNTER — Other Ambulatory Visit: Payer: Self-pay | Admitting: Obstetrics & Gynecology

## 2015-02-18 MED ORDER — PROGESTERONE MICRONIZED 200 MG PO CAPS: 200.0000 mg | ORAL_CAPSULE | Freq: Every day | ORAL | Status: DC

## 2015-02-18 NOTE — Telephone Encounter (Signed)
Called pt and informed biopsy was benign  Will cycle with prometrium 200 x 10 days monthly for a year then maybe a year or two of every 3 months,  FSH quite elevated so clearly post menopausal state  Pt understands

## 2015-02-20 ENCOUNTER — Ambulatory Visit: Payer: 59 | Admitting: Obstetrics & Gynecology

## 2015-02-25 ENCOUNTER — Telehealth: Payer: Self-pay | Admitting: *Deleted

## 2015-02-25 NOTE — Telephone Encounter (Signed)
Pt called stating that she took her progesterone for the first time this morning about 30 minutes ago and she has been dizzy and felt funny ever since. I spoke with Cyril Mourning and she advised that the pt should have taken the medication at night and to make sure she has eaten something before taking the medication. I advised the pt of this and she wanted to know how long the dizziness would last. Victorino Dike advised the pt to get something to eat and drink and that it shouldn't last longer than an hour or so. I advised the pt of this and also to take the medication tomorrow night. Pt verbalized understanding.

## 2015-04-02 DIAGNOSIS — N959 Unspecified menopausal and perimenopausal disorder: Secondary | ICD-10-CM | POA: Diagnosis not present

## 2015-04-02 DIAGNOSIS — R739 Hyperglycemia, unspecified: Secondary | ICD-10-CM | POA: Diagnosis not present

## 2015-04-02 DIAGNOSIS — E559 Vitamin D deficiency, unspecified: Secondary | ICD-10-CM | POA: Diagnosis not present

## 2015-04-06 MED FILL — PROGESTERONE 200 MG CAPSULE: 200 | 30 days supply | Qty: 30 | Fill #0

## 2015-06-09 ENCOUNTER — Other Ambulatory Visit: Payer: Self-pay | Admitting: Obstetrics & Gynecology

## 2015-06-09 DIAGNOSIS — R5383 Other fatigue: Secondary | ICD-10-CM | POA: Diagnosis not present

## 2015-06-09 DIAGNOSIS — N959 Unspecified menopausal and perimenopausal disorder: Secondary | ICD-10-CM | POA: Diagnosis not present

## 2015-06-09 DIAGNOSIS — Z1231 Encounter for screening mammogram for malignant neoplasm of breast: Secondary | ICD-10-CM

## 2015-06-09 DIAGNOSIS — E559 Vitamin D deficiency, unspecified: Secondary | ICD-10-CM | POA: Diagnosis not present

## 2015-06-11 ENCOUNTER — Ambulatory Visit (HOSPITAL_COMMUNITY)
Admission: RE | Admit: 2015-06-11 | Discharge: 2015-06-11 | Disposition: A | Discharge status: Home / Self Care | Payer: 59 | Source: Ambulatory Visit | Attending: Obstetrics & Gynecology | Admitting: Obstetrics & Gynecology

## 2015-06-11 DIAGNOSIS — Z1231 Encounter for screening mammogram for malignant neoplasm of breast: Secondary | ICD-10-CM | POA: Diagnosis not present

## 2015-06-17 ENCOUNTER — Other Ambulatory Visit: Payer: Self-pay | Admitting: Obstetrics & Gynecology

## 2015-06-17 MED ORDER — PROGESTERONE MICRONIZED 200 MG PO CAPS: 200.0000 mg | ORAL_CAPSULE | Freq: Every day | ORAL | Status: DC

## 2015-06-17 MED ORDER — ESTRADIOL 1 MG/GM TD GEL: 1.0000 | Freq: Every day | TRANSDERMAL | Status: DC

## 2015-08-05 DIAGNOSIS — H524 Presbyopia: Secondary | ICD-10-CM | POA: Diagnosis not present

## 2015-08-05 DIAGNOSIS — H02403 Unspecified ptosis of bilateral eyelids: Secondary | ICD-10-CM | POA: Diagnosis not present

## 2015-08-05 DIAGNOSIS — H52223 Regular astigmatism, bilateral: Secondary | ICD-10-CM | POA: Diagnosis not present

## 2015-08-05 DIAGNOSIS — H5213 Myopia, bilateral: Secondary | ICD-10-CM | POA: Diagnosis not present

## 2015-08-07 MED FILL — PROGESTERONE 200 MG CAPSULE: 200 | 30 days supply | Qty: 30 | Fill #1

## 2015-10-13 DIAGNOSIS — R5383 Other fatigue: Secondary | ICD-10-CM | POA: Diagnosis not present

## 2015-10-13 DIAGNOSIS — Z13220 Encounter for screening for lipoid disorders: Secondary | ICD-10-CM | POA: Diagnosis not present

## 2015-10-13 DIAGNOSIS — Z0000 Encounter for general adult medical examination without abnormal findings: Secondary | ICD-10-CM | POA: Diagnosis not present

## 2015-10-13 DIAGNOSIS — Z7189 Other specified counseling: Secondary | ICD-10-CM | POA: Diagnosis not present

## 2015-10-13 DIAGNOSIS — N959 Unspecified menopausal and perimenopausal disorder: Secondary | ICD-10-CM | POA: Diagnosis not present

## 2015-10-13 DIAGNOSIS — Z0389 Encounter for observation for other suspected diseases and conditions ruled out: Secondary | ICD-10-CM | POA: Diagnosis not present

## 2015-10-13 DIAGNOSIS — Z713 Dietary counseling and surveillance: Secondary | ICD-10-CM | POA: Diagnosis not present

## 2015-10-13 DIAGNOSIS — R739 Hyperglycemia, unspecified: Secondary | ICD-10-CM | POA: Diagnosis not present

## 2015-10-13 DIAGNOSIS — E559 Vitamin D deficiency, unspecified: Secondary | ICD-10-CM | POA: Diagnosis not present

## 2015-10-13 MED FILL — ESTRADIOL TDS 0.025 MG/DAY: 0.025 | 28 days supply | Qty: 4 | Fill #0

## 2015-10-26 MED FILL — PROGESTERONE 200 MG CAPSULE: 200 | 30 days supply | Qty: 30 | Fill #2

## 2015-11-02 MED FILL — ESTRADIOL TDS 0.025 MG/DAY: 0.025 | 28 days supply | Qty: 4 | Fill #1

## 2015-12-09 DIAGNOSIS — R220 Localized swelling, mass and lump, head: Secondary | ICD-10-CM | POA: Diagnosis not present

## 2015-12-09 MED FILL — NEOMYCIN/POLY/HC EYE DROPS: 3.5-10000-1 | 5 days supply | Qty: 8 | Fill #0

## 2015-12-21 MED FILL — ESTRADIOL TDS 0.025 MG/DAY: 0.025 | 28 days supply | Qty: 4 | Fill #2

## 2015-12-21 MED FILL — PROGESTERONE 200 MG CAPSULE: 200 | 30 days supply | Qty: 30 | Fill #3

## 2016-02-09 ENCOUNTER — Ambulatory Visit (HOSPITAL_COMMUNITY)
Admission: RE | Admit: 2016-02-09 | Discharge: 2016-02-09 | Disposition: A | Discharge status: Home / Self Care | Payer: 59 | Source: Ambulatory Visit | Attending: Internal Medicine | Admitting: Internal Medicine

## 2016-02-09 ENCOUNTER — Other Ambulatory Visit (HOSPITAL_COMMUNITY): Payer: Self-pay | Admitting: Internal Medicine

## 2016-02-09 DIAGNOSIS — R05 Cough: Secondary | ICD-10-CM | POA: Insufficient documentation

## 2016-02-09 DIAGNOSIS — R059 Cough, unspecified: Secondary | ICD-10-CM

## 2016-05-10 ENCOUNTER — Telehealth: Payer: Self-pay | Admitting: Obstetrics & Gynecology

## 2016-05-11 ENCOUNTER — Other Ambulatory Visit: Payer: Self-pay | Admitting: Obstetrics & Gynecology

## 2016-05-11 MED ORDER — PROGESTERONE MICRONIZED 200 MG PO CAPS: 200.0000 mg | ORAL_CAPSULE | Freq: Every day | ORAL | 11 refills | Status: DC

## 2016-05-11 NOTE — Telephone Encounter (Signed)
Yes continue with the progesterone

## 2016-05-12 MED FILL — PROGESTERONE 200 MG CAPSULE: 200 | 30 days supply | Qty: 30 | Fill #0

## 2016-05-12 NOTE — Telephone Encounter (Signed)
LMOVM that prescription was sent and no need for visit.

## 2016-08-08 DIAGNOSIS — M542 Cervicalgia: Secondary | ICD-10-CM | POA: Diagnosis not present

## 2016-08-24 ENCOUNTER — Telehealth: Payer: Self-pay | Admitting: *Deleted

## 2016-08-24 NOTE — Telephone Encounter (Signed)
That is fine 

## 2016-09-05 ENCOUNTER — Other Ambulatory Visit: Payer: Self-pay | Admitting: Obstetrics & Gynecology

## 2016-09-05 DIAGNOSIS — N950 Postmenopausal bleeding: Secondary | ICD-10-CM

## 2016-09-06 ENCOUNTER — Ambulatory Visit (INDEPENDENT_AMBULATORY_CARE_PROVIDER_SITE_OTHER): Payer: 59

## 2016-09-06 ENCOUNTER — Ambulatory Visit (INDEPENDENT_AMBULATORY_CARE_PROVIDER_SITE_OTHER): Payer: 59 | Admitting: Obstetrics & Gynecology

## 2016-09-06 ENCOUNTER — Encounter: Payer: Self-pay | Admitting: Obstetrics & Gynecology

## 2016-09-06 VITALS — BP 100/60 | HR 80 | Wt 160.0 lb

## 2016-09-06 DIAGNOSIS — N950 Postmenopausal bleeding: Secondary | ICD-10-CM | POA: Diagnosis not present

## 2016-09-06 NOTE — Progress Notes (Signed)
PELVIC US TA/TV:heterogeneous anteverted uterus w/a posterior subserosal left fundal fibroid 1.8  X 1.6 x 1.7 cm N/C,thickened EEC 6.1 mm(no color flow visualized),normal ovaries bilat,left ovary was better visualized on T/A images,right ovary appears mobile,limited view of left ovary w/ TV,no free fluid

## 2016-09-06 NOTE — Progress Notes (Signed)
Follow up appointment for results  Chief Complaint  Patient presents with  . Follow-up    ultrasound    Blood pressure 100/60, pulse 80, weight 160 lb (72.6 kg).  [place] Transvaginal Non-ob  Result Date: 09/06/2016 GYNECOLOGIC SONOGRAM Shelby [name] is a 51 y.o. G1P0010 she is here for a pelvic sonogram to f/u one episode of postmenopausal bleeding. Uterus                      6.5 x 2.5 x 4.2 cm, vol 36 ml,heterogeneous anteverted uterus w/a posterior subserosal left fundal fibroid 1.8  X 1.6 x 1.7                                  cm N/C Endometrium          6.1 mm, symmetrical, thickened endometrium,no color flow visualized Right ovary             1.8 x 1 x 1.7 cm, wnl Left ovary                2.4 x 1.3 x 1.1 cm, wnl No free fluid Technician Comments: PELVIC US TA/TV:heterogeneous anteverted uterus w/a posterior subserosal left fundal fibroid 1.8  X 1.6 x 1.7 cm N/C,thickened EEC 6.1 mm(no color flow visualized),normal ovaries bilat,left ovary was better visualized on T/A images,right ovary appears mobile,limited view of left ovary w/ TV,no free fluid Shelby [name] 09/06/2016 10:47 AM Clinical Impression and recommendations: I have reviewed the sonogram results above, combined with the patient's current clinical course, below are my impressions and any appropriate recommendations for management based on the sonographic findings. Uterus is normal with borderline endometrium Ovaries are normal Shelby [name] H 09/06/2016 11:08 AM   [place] Pelvis Complete  Result Date: 09/06/2016 GYNECOLOGIC SONOGRAM Shelby [name] is a 51 y.o. G1P0010 she is here for a pelvic sonogram to f/u one episode of postmenopausal bleeding. Uterus                      6.5 x 2.5 x 4.2 cm, vol 36 ml,heterogeneous anteverted uterus w/a posterior subserosal left fundal fibroid 1.8  X 1.6 x 1.7                                  cm N/C Endometrium          6.1 mm, symmetrical, thickened endometrium,no color flow visualized Right ovary             1.8  x 1 x 1.7 cm, wnl Left ovary                2.4 x 1.3 x 1.1 cm, wnl No free fluid Technician Comments: PELVIC US TA/TV:heterogeneous anteverted uterus w/a posterior subserosal left fundal fibroid 1.8  X 1.6 x 1.7 cm N/C,thickened EEC 6.1 mm(no color flow visualized),normal ovaries bilat,left ovary was better visualized on T/A images,right ovary appears mobile,limited view of left ovary w/ TV,no free fluid Shelby [name] 09/06/2016 10:47 AM Clinical Impression and recommendations: I have reviewed the sonogram results above, combined with the patient's current clinical course, below are my impressions and any appropriate recommendations for management based on the sonographic findings. Uterus is normal with borderline endometrium Ovaries are normal Shelby [name] H 09/06/2016 11:08 AM      MEDS ordered this encounter: No  orders of the defined types were placed in this encounter.   Orders for this encounter: No orders of the defined types were placed in this encounter.   Impression: PMB (postmenopausal bleeding) - none for 18 months, can stop all progesterone, has been off x 4 months or so    Plan: Yearly visit 04/2017  Follow Up: Return for april 2019 yearly.       Face to face time:  10 minutes  Greater than 50% of the visit time was spent in counseling and coordination of care with the patient.  The summary and outline of the counseling and care coordination is summarized in the note above.   All questions were answered.  Past Medical History:  Diagnosis Date  . Hematuria 09/16/2014  . Urinary frequency 09/16/2014  . UTI (lower urinary tract infection) 09/16/2014  . Vaginal Pap smear, abnormal     Past Surgical History:  Procedure Laterality Date  . BELPHAROPTOSIS REPAIR  1969  . CERVIX LESION DESTRUCTION    . THORACOTOMY  2005  . vuvular repair      OB History    Gravida Para Term Preterm AB Living   1       1     SAB TAB Ectopic Multiple Live Births   1               No Known Allergies  Social History   Social History  . Marital status: Single    Spouse name: N/A  . Number of children: N/A  . Years of education: N/A   Social History Main Topics  . Smoking status: Former Smoker    Types: Cigarettes  . Smokeless tobacco: Never Used     Comment: quit in 1996  . Alcohol use Yes     Comment: every 2 weeks  . Drug use: No  . Sexual activity: Yes    Birth control/ protection: Post-menopausal   Other Topics Concern  . None   Social History Narrative  . None    Family History  Problem Relation Age of Onset  . COPD Mother   . Osteoporosis Mother   . Hypertension Father   . Hypertension Sister   . Stroke Maternal Grandmother   . Hypertension Maternal Grandmother   . Diabetes Maternal Grandmother   . Cancer Maternal Grandfather   . Heart attack Paternal Grandmother   . Kidney failure Paternal Grandfather

## 2016-10-13 DIAGNOSIS — Z0389 Encounter for observation for other suspected diseases and conditions ruled out: Secondary | ICD-10-CM | POA: Diagnosis not present

## 2016-10-13 DIAGNOSIS — R6882 Decreased libido: Secondary | ICD-10-CM | POA: Diagnosis not present

## 2016-10-13 DIAGNOSIS — Z0000 Encounter for general adult medical examination without abnormal findings: Secondary | ICD-10-CM | POA: Diagnosis not present

## 2016-10-13 DIAGNOSIS — E559 Vitamin D deficiency, unspecified: Secondary | ICD-10-CM | POA: Diagnosis not present

## 2016-10-13 DIAGNOSIS — R5383 Other fatigue: Secondary | ICD-10-CM | POA: Diagnosis not present

## 2016-10-13 DIAGNOSIS — N959 Unspecified menopausal and perimenopausal disorder: Secondary | ICD-10-CM | POA: Diagnosis not present

## 2016-12-14 DIAGNOSIS — I781 Nevus, non-neoplastic: Secondary | ICD-10-CM | POA: Diagnosis not present

## 2016-12-14 DIAGNOSIS — Z1283 Encounter for screening for malignant neoplasm of skin: Secondary | ICD-10-CM | POA: Diagnosis not present

## 2017-03-27 ENCOUNTER — Encounter: Payer: Self-pay | Admitting: Neurology

## 2017-03-27 ENCOUNTER — Other Ambulatory Visit (HOSPITAL_COMMUNITY): Payer: Self-pay | Admitting: Internal Medicine

## 2017-03-27 DIAGNOSIS — Z1231 Encounter for screening mammogram for malignant neoplasm of breast: Secondary | ICD-10-CM

## 2017-04-06 ENCOUNTER — Ambulatory Visit (HOSPITAL_COMMUNITY)
Admission: RE | Admit: 2017-04-06 | Discharge: 2017-04-06 | Disposition: A | Discharge status: Home / Self Care | Payer: No Typology Code available for payment source | Source: Ambulatory Visit | Attending: Internal Medicine | Admitting: Internal Medicine

## 2017-04-06 ENCOUNTER — Encounter (HOSPITAL_COMMUNITY): Payer: Self-pay

## 2017-04-06 DIAGNOSIS — Z1231 Encounter for screening mammogram for malignant neoplasm of breast: Secondary | ICD-10-CM | POA: Diagnosis present

## 2017-05-04 ENCOUNTER — Other Ambulatory Visit (HOSPITAL_COMMUNITY)
Admission: RE | Admit: 2017-05-04 | Discharge: 2017-05-04 | Disposition: A | Discharge status: Home / Self Care | Payer: No Typology Code available for payment source | Source: Ambulatory Visit | Attending: Adult Health | Admitting: Adult Health

## 2017-05-04 ENCOUNTER — Ambulatory Visit (INDEPENDENT_AMBULATORY_CARE_PROVIDER_SITE_OTHER): Payer: No Typology Code available for payment source | Admitting: Adult Health

## 2017-05-04 ENCOUNTER — Encounter: Payer: Self-pay | Admitting: Adult Health

## 2017-05-04 VITALS — BP 110/70 | HR 67 | Ht 64.0 in | Wt 161.5 lb

## 2017-05-04 DIAGNOSIS — Z1211 Encounter for screening for malignant neoplasm of colon: Secondary | ICD-10-CM | POA: Diagnosis not present

## 2017-05-04 DIAGNOSIS — Z01419 Encounter for gynecological examination (general) (routine) without abnormal findings: Secondary | ICD-10-CM | POA: Insufficient documentation

## 2017-05-04 DIAGNOSIS — Z1212 Encounter for screening for malignant neoplasm of rectum: Secondary | ICD-10-CM | POA: Diagnosis not present

## 2017-05-04 DIAGNOSIS — Z124 Encounter for screening for malignant neoplasm of cervix: Secondary | ICD-10-CM

## 2017-05-04 LAB — HEMOCCULT GUIAC POC 1CARD (OFFICE): FECAL OCCULT BLD: NEGATIVE

## 2017-05-04 NOTE — Progress Notes (Signed)
Subjective:     Patient ID: Shelby [name], female   DOB: 12/19/1965, 52 y.o.   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 PCP is Dr Karilyn CotaGosrani.  Review of Systems Has had no more PMB Patient denies any headaches, hearing loss, fatigue, blurred vision, shortness of breath, chest pain, abdominal pain, problems with bowel movements, urination, or intercourse. No joint pain or mood swings.She has area on labia that has come up twice, mildly tender. Reviewed past medical,surgical, social and family history. Reviewed medications and allergies.     Objective:   Physical Exam BP 110/70 (BP Location: Left Arm, Patient Position: Sitting, Cuff Size: Normal)   Pulse 67   Ht 5\' 4"  (1.626 m)   Wt 161 lb 8 oz (73.3 kg)   BMI 27.72 kg/m   PHQ 2 score 0.    Skin warm and dry.Pelvic: external genitalia is normal in appearance no lesions, vagina: pale pink,urethra has no lesions or masses noted, cervix:smooth,pap with HPV performed, uterus: normal size, shape and contour, non tender, no masses felt, adnexa: no masses or tenderness noted. Bladder is non tender and no masses felt.  On rectal exam has good tone, no masses or hemorrhoids felt, and hemoccult was negative.  Assessment:     1. Encounter for gynecological examination with Papanicolaou smear of cervix   2. Screening for colorectal cancer       Plan:     Labs with PCP Mammogram yearly Colonoscopy in near future Pap in 3 years if normal

## 2017-05-05 LAB — CYTOLOGY - PAP
Diagnosis: NEGATIVE
HPV (WINDOPATH): NOT DETECTED

## 2017-05-17 ENCOUNTER — Encounter (INDEPENDENT_AMBULATORY_CARE_PROVIDER_SITE_OTHER): Payer: Self-pay | Admitting: *Deleted

## 2017-06-01 ENCOUNTER — Other Ambulatory Visit (INDEPENDENT_AMBULATORY_CARE_PROVIDER_SITE_OTHER): Payer: Self-pay | Admitting: *Deleted

## 2017-06-01 ENCOUNTER — Encounter (INDEPENDENT_AMBULATORY_CARE_PROVIDER_SITE_OTHER): Payer: Self-pay | Admitting: *Deleted

## 2017-06-01 ENCOUNTER — Encounter (INDEPENDENT_AMBULATORY_CARE_PROVIDER_SITE_OTHER): Payer: Self-pay

## 2017-06-01 DIAGNOSIS — Z800 Family history of malignant neoplasm of digestive organs: Secondary | ICD-10-CM | POA: Insufficient documentation

## 2017-06-01 DIAGNOSIS — Z1211 Encounter for screening for malignant neoplasm of colon: Secondary | ICD-10-CM | POA: Insufficient documentation

## 2017-06-21 ENCOUNTER — Encounter (INDEPENDENT_AMBULATORY_CARE_PROVIDER_SITE_OTHER): Payer: Self-pay

## 2017-07-10 ENCOUNTER — Ambulatory Visit: Payer: 59 | Admitting: Neurology

## 2017-07-20 NOTE — H&P (Signed)
Subjective:    Shelby [name] is a 52 y.o. female who presents for evaluation of bilateral upper eyelid ptosis . The pain is described as none. Onset was several months ago. Symptoms have been unchanged since.   Review of Systems Pertinent items are noted in HPI.    Objective:   There were no vitals taken for this visit.  General:  alert, cooperative and appears stated age Skin:  normal Eyes: positive findings: eyelids/periorbital: ptosis bilaterally Mouth: MMM no lesions Lymph Nodes:  Cervical, supraclavicular, and axillary nodes normal. Lungs:  clear to auscultation bilaterally Heart:  regular rate and rhythm, S1, S2 normal, no murmur, click, rub or gallop Abdomen: soft, non-tender; bowel sounds normal; no masses,  no organomegaly CVA:  absent Genitourinary: defer exam Extremities:  extremities normal, atraumatic, no cyanosis or edema Neurologic:  negative Psychiatric:  normal mood, behavior, speech, dress, and thought processes    Assessment: Bilateral Myogenic Ptosis   Plan: Bilateral Internal Ptosis Repair   1. Discussed the risk of surgery,  and the risks of general anesthetic including MI, CVA, sudden death or even reaction to anesthetic medications. The patient understands the risks, any and all questions were answered to the patient's satisfaction. 2. Follow up: 2 weeks.  Date of Surgery Update (To be completed by Attending Surgeon day of surgery.)

## 2017-08-01 ENCOUNTER — Other Ambulatory Visit: Payer: Self-pay

## 2017-08-01 ENCOUNTER — Encounter (HOSPITAL_COMMUNITY): Payer: Self-pay | Admitting: *Deleted

## 2017-08-01 NOTE — Progress Notes (Signed)
Spoke with pt for pre-op call. Pt denies cardiac history, HTN or Diabetes. 

## 2017-08-03 ENCOUNTER — Encounter (HOSPITAL_COMMUNITY): Payer: Self-pay | Admitting: *Deleted

## 2017-08-03 ENCOUNTER — Ambulatory Visit (HOSPITAL_COMMUNITY)
Admission: RE | Admit: 2017-08-03 | Discharge: 2017-08-03 | Disposition: A | Discharge status: Home / Self Care | Payer: No Typology Code available for payment source | Source: Ambulatory Visit | Attending: Oculoplastics Ophthalmology | Admitting: Oculoplastics Ophthalmology

## 2017-08-03 ENCOUNTER — Encounter (HOSPITAL_COMMUNITY)
Admission: RE | Disposition: A | Discharge status: Home / Self Care | Payer: Self-pay | Source: Ambulatory Visit | Attending: Oculoplastics Ophthalmology

## 2017-08-03 ENCOUNTER — Other Ambulatory Visit: Payer: Self-pay

## 2017-08-03 ENCOUNTER — Ambulatory Visit (HOSPITAL_COMMUNITY): Payer: No Typology Code available for payment source | Admitting: Certified Registered"

## 2017-08-03 DIAGNOSIS — H02423 Myogenic ptosis of bilateral eyelids: Secondary | ICD-10-CM | POA: Diagnosis not present

## 2017-08-03 DIAGNOSIS — Z87891 Personal history of nicotine dependence: Secondary | ICD-10-CM | POA: Insufficient documentation

## 2017-08-03 DIAGNOSIS — H02403 Unspecified ptosis of bilateral eyelids: Secondary | ICD-10-CM | POA: Diagnosis present

## 2017-08-03 HISTORY — PX: PTOSIS REPAIR: SHX6568

## 2017-08-03 LAB — BASIC METABOLIC PANEL
ANION GAP: 10 (ref 5–15)
BUN: 13 mg/dL (ref 6–20)
CALCIUM: 9.5 mg/dL (ref 8.9–10.3)
CO2: 25 mmol/L (ref 22–32)
Chloride: 107 mmol/L (ref 98–111)
Creatinine, Ser: 0.86 mg/dL (ref 0.44–1.00)
GFR calc Af Amer: >60 mL/min (ref >60)
GFR calc non Af Amer: >60 mL/min (ref >60)
GLUCOSE: 92 mg/dL (ref 70–99)
POTASSIUM: 3.7 mmol/L (ref 3.5–5.1)
Sodium: 142 mmol/L (ref 135–145)

## 2017-08-03 LAB — PROTIME-INR
INR: 1.05
Prothrombin Time: 13.6 seconds (ref 11.4–15.2)

## 2017-08-03 LAB — CBC
HEMATOCRIT: 46.9 % — AB (ref 36.0–46.0)
Hemoglobin: 15.1 g/dL — ABNORMAL HIGH (ref 12.0–15.0)
MCH: 30.6 pg (ref 26.0–34.0)
MCHC: 32.2 g/dL (ref 30.0–36.0)
MCV: 94.9 fL (ref 78.0–100.0)
Platelets: 178 10*3/uL (ref 150–400)
RBC: 4.94 MIL/uL (ref 3.87–5.11)
RDW: 12.2 % (ref 11.5–15.5)
WBC: 5.7 10*3/uL (ref 4.0–10.5)

## 2017-08-03 SURGERY — REPAIR, BLEPHAROPTOSIS
Anesthesia: Monitor Anesthesia Care | Site: Eye | Laterality: Bilateral

## 2017-08-03 MED ORDER — OXYCODONE HCL 5 MG/5ML PO SOLN: 5.0000 mg | Freq: Once | ORAL | Status: DC | PRN

## 2017-08-03 MED ORDER — TOBRAMYCIN-DEXAMETHASONE 0.3-0.1 % OP OINT
TOPICAL_OINTMENT | OPHTHALMIC | Status: AC
  Filled 2017-08-03: qty 3.5

## 2017-08-03 MED ORDER — DEXAMETHASONE SODIUM PHOSPHATE 10 MG/ML IJ SOLN
INTRAMUSCULAR | Status: DC | PRN
  Administered 2017-08-03: 4 mg via INTRAVENOUS

## 2017-08-03 MED ORDER — TOBRAMYCIN-DEXAMETHASONE 0.3-0.1 % OP OINT
TOPICAL_OINTMENT | OPHTHALMIC | Status: DC | PRN
  Administered 2017-08-03: 1 via OPHTHALMIC

## 2017-08-03 MED ORDER — SODIUM CHLORIDE 0.9 % IV SOLN
INTRAVENOUS | Status: DC
  Administered 2017-08-03: 08:00:00 via INTRAVENOUS

## 2017-08-03 MED ORDER — MIDAZOLAM HCL 2 MG/2ML IJ SOLN
INTRAMUSCULAR | Status: AC
  Filled 2017-08-03: qty 2

## 2017-08-03 MED ORDER — ONDANSETRON HCL 4 MG/2ML IJ SOLN
INTRAMUSCULAR | Status: DC | PRN
  Administered 2017-08-03: 4 mg via INTRAVENOUS

## 2017-08-03 MED ORDER — OXYCODONE HCL 5 MG PO TABS: 5.0000 mg | ORAL_TABLET | Freq: Once | ORAL | Status: DC | PRN

## 2017-08-03 MED ORDER — ONDANSETRON HCL 4 MG/2ML IJ SOLN
INTRAMUSCULAR | Status: AC
  Filled 2017-08-03: qty 2

## 2017-08-03 MED ORDER — PROPOFOL 500 MG/50ML IV EMUL
INTRAVENOUS | Status: DC | PRN
  Administered 2017-08-03: 50 ug/kg/min via INTRAVENOUS

## 2017-08-03 MED ORDER — PROPOFOL 10 MG/ML IV BOLUS
INTRAVENOUS | Status: AC
  Filled 2017-08-03: qty 20

## 2017-08-03 MED ORDER — FENTANYL CITRATE (PF) 100 MCG/2ML IJ SOLN
INTRAMUSCULAR | Status: DC | PRN
  Administered 2017-08-03: 100 ug via INTRAVENOUS
  Administered 2017-08-03 (×2): 50 ug via INTRAVENOUS

## 2017-08-03 MED ORDER — HYDROCODONE-ACETAMINOPHEN 5-325 MG PO TABS
1.0000 | ORAL_TABLET | Freq: Once | ORAL | Status: AC
  Administered 2017-08-03: 1 via ORAL

## 2017-08-03 MED ORDER — LIDOCAINE-EPINEPHRINE 1 %-1:100000 IJ SOLN
INTRAMUSCULAR | Status: DC | PRN
  Administered 2017-08-03: 4 mL

## 2017-08-03 MED ORDER — LIDOCAINE-EPINEPHRINE 1 %-1:100000 IJ SOLN
INTRAMUSCULAR | Status: AC
  Filled 2017-08-03: qty 1

## 2017-08-03 MED ORDER — BUPIVACAINE HCL (PF) 0.5 % IJ SOLN
INTRAMUSCULAR | Status: AC
  Filled 2017-08-03: qty 30

## 2017-08-03 MED ORDER — FENTANYL CITRATE (PF) 100 MCG/2ML IJ SOLN
INTRAMUSCULAR | Status: AC
  Administered 2017-08-03: 25 ug via INTRAVENOUS
  Filled 2017-08-03: qty 2

## 2017-08-03 MED ORDER — MIDAZOLAM HCL 5 MG/5ML IJ SOLN
INTRAMUSCULAR | Status: DC | PRN
  Administered 2017-08-03: 2 mg via INTRAVENOUS

## 2017-08-03 MED ORDER — HYDROCODONE-ACETAMINOPHEN 5-325 MG PO TABS: 2.0000 | ORAL_TABLET | Freq: Four times a day (QID) | ORAL | 0 refills | Status: DC | PRN

## 2017-08-03 MED ORDER — FENTANYL CITRATE (PF) 100 MCG/2ML IJ SOLN
25.0000 ug | INTRAMUSCULAR | Status: DC | PRN
  Administered 2017-08-03 (×3): 25 ug via INTRAVENOUS

## 2017-08-03 MED ORDER — PROPOFOL 10 MG/ML IV BOLUS
INTRAVENOUS | Status: DC | PRN
  Administered 2017-08-03 (×2): 10 mg via INTRAVENOUS

## 2017-08-03 MED ORDER — FENTANYL CITRATE (PF) 250 MCG/5ML IJ SOLN
INTRAMUSCULAR | Status: AC
  Filled 2017-08-03: qty 5

## 2017-08-03 MED ORDER — BSS IO SOLN
INTRAOCULAR | Status: AC
  Filled 2017-08-03: qty 15

## 2017-08-03 MED ORDER — HYDROCODONE-ACETAMINOPHEN 5-325 MG PO TABS
ORAL_TABLET | ORAL | Status: AC
  Filled 2017-08-03: qty 1

## 2017-08-03 MED ORDER — DEXAMETHASONE SODIUM PHOSPHATE 10 MG/ML IJ SOLN
INTRAMUSCULAR | Status: AC
  Filled 2017-08-03: qty 1

## 2017-08-03 SURGICAL SUPPLY — 24 items
APL SRG 3 HI ABS STRL LF PLS (MISCELLANEOUS) ×1
APPLICATOR COTTON TIP 6IN STRL (MISCELLANEOUS) ×2 IMPLANT
APPLICATOR DR MATTHEWS STRL (MISCELLANEOUS) ×2 IMPLANT
CLSR STERI-STRIP ANTIMIC 1/2X4 (GAUZE/BANDAGES/DRESSINGS) ×2 IMPLANT
COVER SURGICAL LIGHT HANDLE (MISCELLANEOUS) ×2 IMPLANT
DRAPE ORTHO SPLIT 87X125 STRL (DRAPES) ×2 IMPLANT
GLOVE BIO SURGEON STRL SZ7.5 (GLOVE) ×4 IMPLANT
GOWN STRL REUS W/ TWL LRG LVL3 (GOWN DISPOSABLE) ×2 IMPLANT
GOWN STRL REUS W/TWL LRG LVL3 (GOWN DISPOSABLE) ×4
KIT BASIN OR (CUSTOM PROCEDURE TRAY) ×2 IMPLANT
NDL HYPO 30X.5 LL (NEEDLE) IMPLANT
NDL PRECISIONGLIDE 27X1.5 (NEEDLE) IMPLANT
NEEDLE HYPO 30X.5 LL (NEEDLE) ×2 IMPLANT
NEEDLE PRECISIONGLIDE 27X1.5 (NEEDLE) ×2 IMPLANT
NS IRRIG 1000ML POUR BTL (IV SOLUTION) ×2 IMPLANT
PACK CATARACT CUSTOM (CUSTOM PROCEDURE TRAY) ×2 IMPLANT
PAD ARMBOARD 7.5X6 YLW CONV (MISCELLANEOUS) ×4 IMPLANT
STRIP CLOSURE SKIN 1/2X4 (GAUZE/BANDAGES/DRESSINGS) ×2 IMPLANT
SUT ETHILON 7 0 P 1 (SUTURE) IMPLANT
SUT PLAIN 5 0 P 3 18 (SUTURE) ×3 IMPLANT
SUT SILK 4 0 P 3 (SUTURE) ×2 IMPLANT
SUT SILK 6 0 BV 1XDISCX (SUTURE) IMPLANT
TOWEL OR 17X24 6PK STRL BLUE (TOWEL DISPOSABLE) ×4 IMPLANT
WATER STERILE IRR 1000ML POUR (IV SOLUTION) ×2 IMPLANT

## 2017-08-03 NOTE — Interval H&P Note (Signed)
History and Physical Interval Note:  08/03/2017 8:44 AM  Shelby [name]  has presented today for surgery, with the diagnosis of MYOGENIC PTOSIS BILATERAL UPPER EYELID  The various methods of treatment have been discussed with the patient and family. After consideration of risks, benefits and other options for treatment, the patient has consented to  Procedure(s): INTERNAL PTOSIS REPAIR BILATERAL (Bilateral) as a surgical intervention .  The patient's history has been reviewed, patient examined, no change in status, stable for surgery.  I have reviewed the patient's chart and labs.  Questions were answered to the patient's satisfaction.     Floydene FlockUsiwoma Ene Abugo

## 2017-08-03 NOTE — Anesthesia Postprocedure Evaluation (Signed)
Anesthesia Post Note  Patient: Drue Novel  Procedure(s) Performed: INTERNAL PTOSIS REPAIR BILATERAL (Bilateral Eye)     Patient location during evaluation: PACU Anesthesia Type: MAC Level of consciousness: awake and alert Pain management: pain level controlled Vital Signs Assessment: post-procedure vital signs reviewed and stable Respiratory status: spontaneous breathing, nonlabored ventilation, respiratory function stable and patient connected to nasal cannula oxygen Cardiovascular status: stable and blood pressure returned to baseline Postop Assessment: no apparent nausea or vomiting Anesthetic complications: no    Last Vitals:  Vitals:   08/03/17 1000 08/03/17 1015  BP: 114/71 129/71  Pulse: 69 (!) 58  Resp: 16 13  Temp:    SpO2: 95% 98%    Last Pain:  Vitals:   08/03/17 0750  TempSrc: Oral  PainSc:                  Chino Sardo

## 2017-08-03 NOTE — Op Note (Signed)
Procedure(s): INTERNAL PTOSIS REPAIR BILATERAL Procedure Note  Shelby [name] female 52 y.o. 08/03/2017  Procedure(s) and Anesthesia Type:    * INTERNAL PTOSIS REPAIR BILATERAL - General  Surgeon(s) and Role:    * Kael Keetch, Levester FreshUsiwoma Ene, MD - Primary  ASA Class: 2   Procedure Detail  INTERNAL PTOSIS REPAIR BILATERAL PREOP DIAGNOSIS:    Bilateral Upper eyelid myogenic ptosis POSTOP DIAGNOSIS:    Same  PROCEDURE:    Internal Ptosis Repair, Bilateral Upper Eyelid [id number]  SURGEON:    Joycelyn DasUsiwoma Vergia Chea MD  ANESTHESIA:   IV Sedation Monitored Anesthesia Care with local standard oculoplastic block     Lidocaine 2% with Epinephrine 1:100,000/Marcaine 0.75%     Total volume used: 2 mL  COMPLICATIONS:  None BLOOD LOSS:  Minimal SPECIMEN:  None  INDICATIONS:  This patient presents with bilateral  upper eyelid position that is too low (MRD1 < 2) and good levator function. The position of the eyelid is causing visual dysfunction that interferes with tasks of daily living including reading and computer use due to the superiorvisual field defect.  Informed consent had been provided prior to the day of surgery at the clinic visit. A handout was provided and the patient was asked to sign an eyelid informed consent specific to this procedure. All questions were answered.  The patient understands that the goal of surgery is to improve visual function. This is not a cosmetic procedure, no skin or fat is being removed. Prior to entering the operative suite, the general surgical consent was signed. All questions were answered.  The patient understands the risks of surgery include but are not limited to bleeding, infection, scar formation, asymmetry, the need for additional surgical intervention and other less common outcomes noted on the specific eyelid surgery consent and anesthesia risks listed on the anesthesia consent. The patient accepts the risks and desires to proceed with surgery as the position of  the upper eyelid blocks the superior visual field interrupting tasks of daily living to include reading, driving and computer use.   PROCEDURE :  The patient was taken to the operating room and placed in the supine position with the usual monitoring in place. The patient was given a drop of Proparacaine 0.5%, then prepped and draped in the usual standard fashion. The lateral eyelid was injected with standard oculoplastic block in the region of entry and exit for the running suture. The eyelid was then everted over a desmarres retractor and injected with the standard oculoplastic block.    In clinic, the patient had complete correction with one drop of phenylephrine. The goal was to perform a 7 mm tuck in the conjunctiva, so the conjunctiva was marked at 3.5 mm from the tarsal border in a medial, central and lateral position with three interrupted 6-0 silk sutures which were then secured with a steristrip. The sutures were then pulled inferiorly and the putterman ptosis clamp was placed in standard fashion.    Using the Putterman ptosis clamp to provide traction, a 5-0 gut suture was passed through the upper lid laterally approximately 2 mm from the eyelashes and passed in a running fashion from lateral aspect of the Putterman ptosis clamp medially and back again laterally and then brought out through the skin adjacent to the entry location and tied externally to avoid corneal irritation.  The silk suture and approximately 1 mm of conjunctiva and mueller's muscle was excised in such a fashion to create a smooth internal wound edge. The external knot was  then covered with a 1 cm steristrip.  Attention was turned to the other eyelid, where the same procedure was performed.    Erythromycin 0.3% ointment were administered to bilateral eyes, The patient was taken to the PACU in good condition.  Findings: none  Estimated Blood Loss:  Minimal         Drains: none         Total IV Fluids: <2L  Blood  Given: none          Specimens: none         Implants: none        Complications:  * No complications entered in OR log *         Disposition: PACU - hemodynamically stable.         Condition: stable

## 2017-08-03 NOTE — Anesthesia Preprocedure Evaluation (Addendum)
Anesthesia Evaluation  Patient identified by MRN, date of birth, ID band Patient awake    Reviewed: Allergy & Precautions, NPO status , Patient's Chart, lab work & pertinent test results  History of Anesthesia Complications Negative for: history of anesthetic complications  Airway Mallampati: II  TM Distance: >3 FB Neck ROM: Full    Dental  (+) Teeth Intact, Dental Advisory Given   Pulmonary neg shortness of breath, neg sleep apnea, neg COPD, neg recent URI, former smoker,    breath sounds clear to auscultation       Cardiovascular negative cardio ROS   Rhythm:Regular     Neuro/Psych negative neurological ROS  negative psych ROS   GI/Hepatic negative GI ROS, Neg liver ROS,   Endo/Other  negative endocrine ROS  Renal/GU negative Renal ROS     Musculoskeletal   Abdominal   Peds  Hematology negative hematology ROS (+)   Anesthesia Other Findings   Reproductive/Obstetrics                            Anesthesia Physical Anesthesia Plan  ASA: II  Anesthesia Plan: MAC   Post-op Pain Management:    Induction:   PONV Risk Score and Plan: 2 and Propofol infusion, Midazolam and Treatment may vary due to age or medical condition  Airway Management Planned: Nasal Cannula and Natural Airway  Additional Equipment: None  Intra-op Plan:   Post-operative Plan:   Informed Consent: I have reviewed the patients History and Physical, chart, labs and discussed the procedure including the risks, benefits and alternatives for the proposed anesthesia with the patient or authorized representative who has indicated his/her understanding and acceptance.   Dental advisory given  Plan Discussed with: CRNA, Anesthesiologist and Surgeon  Anesthesia Plan Comments:        Anesthesia Quick Evaluation

## 2017-08-03 NOTE — Transfer of Care (Signed)
Immediate Anesthesia Transfer of Care Note  Patient: Shelby [name]  Procedure(s) Performed: INTERNAL PTOSIS REPAIR BILATERAL (Bilateral Eye)  Patient Location: PACU  Anesthesia Type:MAC  Level of Consciousness: awake, oriented and patient cooperative  Airway & Oxygen Therapy: Patient Spontanous Breathing and Patient connected to nasal cannula oxygen  Post-op Assessment: Report given to RN and Post -op Vital signs reviewed and stable  Post vital signs: Reviewed and stable  Last Vitals:  Vitals Value Taken Time  BP 116/63 08/03/2017  9:50 AM  Temp    Pulse 69 08/03/2017  9:51 AM  Resp 14 08/03/2017  9:51 AM  SpO2 97 % 08/03/2017  9:51 AM  Vitals shown include unvalidated device data.  Last Pain:  Vitals:   08/03/17 0750  TempSrc: Oral  PainSc:       Patients Stated Pain Goal: 4 (42/87/68 1157)  Complications: No apparent anesthesia complications

## 2017-08-03 NOTE — Discharge Instructions (Signed)
Place ice on both upper eyelid for 20-30 mins every 2 hours for 2 days. Place ointment(Erythromycin or Tobradex Ophthalmic Ointment) in eye at nighttime for one week.  Place one drop(Maxitrol Ophthalmic Eyedrops) in both eye two times a day for one week.  Leave Steri-strip in place until post-operative appointment. If you have an eye patch. Remove it in 24hrs.

## 2017-08-03 NOTE — Brief Op Note (Signed)
08/03/2017  8:44 AM  PATIENT:  Shelby [name]  52 y.o. female  PRE-OPERATIVE DIAGNOSIS:  MYOGENIC PTOSIS BILATERAL UPPER EYELID  POST-OPERATIVE DIAGNOSIS:  * No post-op diagnosis entered *  PROCEDURE:  Procedure(s): INTERNAL PTOSIS REPAIR BILATERAL (Bilateral)  SURGEON:  Surgeon(s) and Role:    * Carine Nordgren, Levester FreshUsiwoma Ene, MD - Primary  PHYSICIAN ASSISTANT:   ASSISTANTS: none   ANESTHESIA:   local and general  EBL:  Minimal    BLOOD ADMINISTERED:none  DRAINS: none   LOCAL MEDICATIONS USED:  BUPIVICAINE  and LIDOCAINE   SPECIMEN:  No Specimen  DISPOSITION OF SPECIMEN:  N/A  COUNTS:  YES  TOURNIQUET:  * No tourniquets in log *  DICTATION: .Note written in EPIC  PLAN OF CARE: Discharge to home after PACU  PATIENT DISPOSITION:  PACU - hemodynamically stable.   Delay start of Pharmacological VTE agent (>24hrs) due to surgical blood loss or risk of bleeding: no

## 2017-08-04 ENCOUNTER — Encounter (HOSPITAL_COMMUNITY): Payer: Self-pay | Admitting: Oculoplastics Ophthalmology

## 2017-09-28 ENCOUNTER — Telehealth (INDEPENDENT_AMBULATORY_CARE_PROVIDER_SITE_OTHER): Payer: Self-pay | Admitting: *Deleted

## 2017-09-28 ENCOUNTER — Encounter (INDEPENDENT_AMBULATORY_CARE_PROVIDER_SITE_OTHER): Payer: Self-pay | Admitting: *Deleted

## 2017-09-28 NOTE — Telephone Encounter (Signed)
Patient need suprep ?

## 2017-09-29 MED ORDER — SUPREP BOWEL PREP KIT 17.5-3.13-1.6 GM/177ML PO SOLN: 1.0000 | Freq: Once | ORAL | 0 refills | Status: AC

## 2017-09-29 MED FILL — SUPREP BOWEL PREP KIT: 17.5-3.13-1 | 1 days supply | Qty: 354 | Fill #0

## 2017-10-04 ENCOUNTER — Encounter (INDEPENDENT_AMBULATORY_CARE_PROVIDER_SITE_OTHER): Payer: Self-pay

## 2017-10-12 ENCOUNTER — Telehealth (INDEPENDENT_AMBULATORY_CARE_PROVIDER_SITE_OTHER): Payer: Self-pay | Admitting: *Deleted

## 2017-10-12 NOTE — Telephone Encounter (Signed)
agree

## 2017-10-12 NOTE — Telephone Encounter (Signed)
Referring MD/PCP: gosrani   Procedure: tcs  Reason/Indication:  Screening, fam hx colon ca  Has patient had this procedure before?  no  If so, when, by whom and where?    Is there a family history of colon cancer?  Yes, grandmother  Who?  What age when diagnosed?    Is patient diabetic?   no      Does patient have prosthetic heart valve or mechanical valve?  no  Do you have a pacemaker?  no  Has patient ever had endocarditis? no  Has patient had joint replacement within last 12 months?  no  Is patient constipated or do they take laxatives? no  Does patient have a history of alcohol/drug use?  no  Is patient on blood thinner such as Coumadin, Plavix and/or Aspirin? no  Medications: MVI, Vit D, Advil prn  Allergies: nkda  Medication Adjustment per Dr Keane Policeehman/Terri Setzer, NP:   Procedure date & time: 10/25/17 at 730

## 2017-10-25 ENCOUNTER — Ambulatory Visit (HOSPITAL_COMMUNITY)
Admission: RE | Admit: 2017-10-25 | Discharge: 2017-10-25 | Disposition: A | Discharge status: Home / Self Care | Payer: No Typology Code available for payment source | Source: Ambulatory Visit | Attending: Internal Medicine | Admitting: Internal Medicine

## 2017-10-25 ENCOUNTER — Encounter (HOSPITAL_COMMUNITY): Payer: Self-pay | Admitting: *Deleted

## 2017-10-25 ENCOUNTER — Encounter (HOSPITAL_COMMUNITY)
Admission: RE | Disposition: A | Discharge status: Home / Self Care | Payer: Self-pay | Source: Ambulatory Visit | Attending: Internal Medicine

## 2017-10-25 ENCOUNTER — Other Ambulatory Visit: Payer: Self-pay

## 2017-10-25 DIAGNOSIS — Z1211 Encounter for screening for malignant neoplasm of colon: Secondary | ICD-10-CM

## 2017-10-25 DIAGNOSIS — Z87891 Personal history of nicotine dependence: Secondary | ICD-10-CM | POA: Diagnosis not present

## 2017-10-25 DIAGNOSIS — K644 Residual hemorrhoidal skin tags: Secondary | ICD-10-CM

## 2017-10-25 DIAGNOSIS — K6289 Other specified diseases of anus and rectum: Secondary | ICD-10-CM

## 2017-10-25 DIAGNOSIS — K5730 Diverticulosis of large intestine without perforation or abscess without bleeding: Secondary | ICD-10-CM

## 2017-10-25 DIAGNOSIS — Z800 Family history of malignant neoplasm of digestive organs: Secondary | ICD-10-CM | POA: Insufficient documentation

## 2017-10-25 HISTORY — PX: COLONOSCOPY: SHX5424

## 2017-10-25 SURGERY — COLONOSCOPY
Anesthesia: Moderate Sedation

## 2017-10-25 MED ORDER — STERILE WATER FOR IRRIGATION IR SOLN
Status: DC | PRN
  Administered 2017-10-25: 08:00:00

## 2017-10-25 MED ORDER — MEPERIDINE HCL 50 MG/ML IJ SOLN
INTRAMUSCULAR | Status: AC
  Filled 2017-10-25: qty 1

## 2017-10-25 MED ORDER — MIDAZOLAM HCL 5 MG/5ML IJ SOLN
INTRAMUSCULAR | Status: DC | PRN
  Administered 2017-10-25: 2 mg via INTRAVENOUS
  Administered 2017-10-25 (×2): 1 mg via INTRAVENOUS
  Administered 2017-10-25: 2 mg via INTRAVENOUS

## 2017-10-25 MED ORDER — SODIUM CHLORIDE 0.9 % IV SOLN
INTRAVENOUS | Status: DC
  Administered 2017-10-25: 1000 mL via INTRAVENOUS

## 2017-10-25 MED ORDER — MIDAZOLAM HCL 5 MG/5ML IJ SOLN
INTRAMUSCULAR | Status: AC
  Filled 2017-10-25: qty 10

## 2017-10-25 MED ORDER — MEPERIDINE HCL 50 MG/ML IJ SOLN
INTRAMUSCULAR | Status: DC | PRN
  Administered 2017-10-25 (×2): 25 mg via INTRAVENOUS

## 2017-10-25 NOTE — Discharge Instructions (Signed)
Resume usual medications as before. High-fiber diet. No driving for 24 hours. Next screening colonoscopy in 10 years.   Colonoscopy, Adult, Care After This sheet gives you information about how to care for yourself after your procedure. Your doctor may also give you more specific instructions. If you have problems or questions, call your doctor. Follow these instructions at home: General instructions   For the first 24 hours after the procedure: ? Do not drive or use machinery. ? Do not sign important documents. ? Do not drink alcohol. ? Do your daily activities more slowly than normal. ? Eat foods that are soft and easy to digest. ? Rest often.  Take over-the-counter or prescription medicines only as told by your doctor.  It is up to you to get the results of your procedure. Ask your doctor, or the department performing the procedure, when your results will be ready. To help cramping and bloating:  Try walking around.  Put heat on your belly (abdomen) as told by your doctor. Use a heat source that your doctor recommends, such as a moist heat pack or a heating pad. ? Put a towel between your skin and the heat source. ? Leave the heat on for 20-30 minutes. ? Remove the heat if your skin turns bright red. This is especially important if you cannot feel pain, heat, or cold. You can get burned. Eating and drinking  Drink enough fluid to keep your pee (urine) clear or pale yellow.  Return to your normal diet as told by your doctor. Avoid heavy or fried foods that are hard to digest.  Avoid drinking alcohol for as long as told by your doctor. Contact a doctor if:  You have blood in your poop (stool) 2-3 days after the procedure. Get help right away if:  You have more than a small amount of blood in your poop.  You see large clumps of tissue (blood clots) in your poop.  Your belly is swollen.  You feel sick to your stomach (nauseous).  You throw up (vomit).  You have a  fever.  You have belly pain that gets worse, and medicine does not help your pain. This information is not intended to replace advice given to you by your health care provider. Make sure you discuss any questions you have with your health care provider. Document Released: 02/12/2010 Document Revised: 10/05/2015 Document Reviewed: 10/05/2015 Elsevier Interactive Patient Education  2017 Elsevier Inc[employer]  Diverticulosis Diverticulosis is a condition that develops when small pouches (diverticula) form in the wall of the large intestine (colon). The colon is where water is absorbed and stool is formed. The pouches form when the inside layer of the colon pushes through weak spots in the outer layers of the colon. You may have a few pouches or many of them. What are the causes? The cause of this condition is not known. What increases the risk? The following factors may make you more likely to develop this condition:  Being older than age 12. Your risk for this condition increases with age. Diverticulosis is rare among people younger than age 58. By age 49, many people have it.  Eating a low-fiber diet.  Having frequent constipation.  Being overweight.  Not getting enough exercise.  Smoking.  Taking over-the-counter pain medicines, like aspirin and ibuprofen.  Having a family history of diverticulosis.  What are the signs or symptoms? In most people, there are no symptoms of this condition. If you do have symptoms, they may include:  Bloating. °· Cramps in the abdomen. °· Constipation or diarrhea. °· Pain in the lower left side of the abdomen. ° °How is this diagnosed? °This condition is most often diagnosed during an exam for other colon problems. Because diverticulosis usually has no symptoms, it often cannot be diagnosed independently. This condition may be diagnosed by: °· Using a flexible scope to examine the colon (colonoscopy). °· Taking an X-ray of the colon after dye has been put into  the colon (barium enema). °· Doing a CT scan. ° °How is this treated? °You may not need treatment for this condition if you have never developed an infection related to diverticulosis. If you have had an infection before, treatment may include: °· Eating a high-fiber diet. This may include eating more fruits, vegetables, and grains. °· Taking a fiber supplement. °· Taking a live bacteria supplement (probiotic). °· Taking medicine to relax your colon. °· Taking antibiotic medicines. ° °Follow these instructions at home: °· Drink 6-8 glasses of water or more each day to prevent constipation. °· Try not to strain when you have a bowel movement. °· If you have had an infection before: °? Eat more fiber as directed by your health care provider or your diet and nutrition specialist (dietitian). °? Take a fiber supplement or probiotic, if your health care provider approves. °· Take over-the-counter and prescription medicines only as told by your health care provider. °· If you were prescribed an antibiotic, take it as told by your health care provider. Do not stop taking the antibiotic even if you start to feel better. °· Keep all follow-up visits as told by your health care provider. This is important. °Contact a health care provider if: °· You have pain in your abdomen. °· You have bloating. °· You have cramps. °· You have not had a bowel movement in 3 days. °Get help right away if: °· Your pain gets worse. °· Your bloating becomes very bad. °· You have a fever or chills, and your symptoms suddenly get worse. °· You vomit. °· You have bowel movements that are bloody or black. °· You have bleeding from your rectum. °Summary °· Diverticulosis is a condition that develops when small pouches (diverticula) form in the wall of the large intestine (colon). °· You may have a few pouches or many of them. °· This condition is most often diagnosed during an exam for other colon problems. °· If you have had an infection related to  diverticulosis, treatment may include increasing the fiber in your diet, taking supplements, or taking medicines. °This information is not intended to replace advice given to you by your health care provider. Make sure you discuss any questions you have with your health care provider. °Document Released: 10/08/2003 Document Revised: 11/30/2015 Document Reviewed: 11/30/2015 °Elsevier Interactive Patient Education © 2017 Elsevier Inc. ° °

## 2017-10-25 NOTE — Op Note (Signed)
[facility] Patient Name: Shelby [name] Procedure Date: 10/25/2017 7:16 AM MRN: [id number] Date of Birth: 22-Jul-1965 Attending MD: Lionel December , MD CSN: [id number] Age: 52 Admit Type: Outpatient Procedure:                Colonoscopy Indications:              Screening for colorectal malignant neoplasm Providers:                Lionel December, MD, Loma Messing B. Patsy Lager, RN, Edythe Clarity, Technician Referring MD:             Wilson Singer, MD Medicines:                Meperidine 50 mg IV, Midazolam 6 mg IV Complications:            No immediate complications. Estimated Blood Loss:     Estimated blood loss: none. Procedure:                Pre-Anesthesia Assessment:                           - Prior to the procedure, a History and Physical                            was performed, and patient medications and                            allergies were reviewed. The patient's tolerance of                            previous anesthesia was also reviewed. The risks                            and benefits of the procedure and the sedation                            options and risks were discussed with the patient.                            All questions were answered, and informed consent                            was obtained. Prior Anticoagulants: The patient has                            taken no previous anticoagulant or antiplatelet                            agents. ASA Grade Assessment: I - A normal, healthy                            patient. After reviewing the risks and benefits,  the patient was deemed in satisfactory condition to                            undergo the procedure.                           After obtaining informed consent, the colonoscope                            was passed under direct vision. Throughout the                            procedure, the patient's blood pressure, pulse, and       oxygen saturations were monitored continuously. The                            [id number]-[id number] ([id number]) scope was introduced through                            the anus and advanced to the the cecum, identified                            by appendiceal orifice and ileocecal valve. The                            colonoscopy was performed without difficulty. The                            patient tolerated the procedure well. The quality                            of the bowel preparation was excellent. The                            ileocecal valve, appendiceal orifice, and rectum                            were photographed. Scope In: 7:44:23 AM Scope Out: 8:00:00 AM Scope Withdrawal Time: 0 hours 7 minutes 22 seconds  Total Procedure Duration: 0 hours 15 minutes 37 seconds  Findings:      The perianal and digital rectal examinations were normal.      Multiple small and large-mouthed diverticula were found in the sigmoid       colon.      The exam was otherwise normal throughout the examined colon.      External hemorrhoids were found during retroflexion. The hemorrhoids       were small.      Anal papilla was hypertrophied. Impression:               - Diverticulosis in the sigmoid colon.                           - External hemorrhoids.                           -  Single small anal papilla.                           - No specimens collected. Moderate Sedation:      Moderate (conscious) sedation was administered by the endoscopy nurse       and supervised by the endoscopist. The following parameters were       monitored: oxygen saturation, heart rate, blood pressure, CO2       capnography and response to care. Total physician intraservice time was       21 minutes. Recommendation:           - Patient has a contact number available for                            emergencies. The signs and symptoms of potential                            delayed complications were discussed with the                             patient. Return to normal activities tomorrow.                            Written discharge instructions were provided to the                            patient.                           - High fiber diet today.                           - Continue present medications.                           - Repeat colonoscopy in 10 years for screening                            purposes. Procedure Code(s):        --- Professional ---                           [phone], Colonoscopy, flexible; diagnostic, including                            collection of specimen(s) by brushing or washing,                            when performed (separate procedure)                           G0500, Moderate sedation services provided by the                            same physician or other qualified health care  professional performing a gastrointestinal                            endoscopic service that sedation supports,                            requiring the presence of an independent trained                            observer to assist in the monitoring of the                            patient's level of consciousness and physiological                            status; initial 15 minutes of intra-service time;                            patient age 91 years or older (additional time may                            be reported with 69629, as appropriate) Diagnosis Code(s):        --- Professional ---                           Z12.11, Encounter for screening for malignant                            neoplasm of colon                           K64.4, Residual hemorrhoidal skin tags                           K62.89, Other specified diseases of anus and rectum                           K57.30, Diverticulosis of large intestine without                            perforation or abscess without bleeding CPT copyright 2017 American Medical Association. All rights  reserved. The codes documented in this report are preliminary and upon coder review may  be revised to meet current compliance requirements. Lionel December, MD Lionel December, MD 10/25/2017 8:11:14 AM This report has been signed electronically. Number of Addenda: 0

## 2017-10-25 NOTE — H&P (Signed)
Shelby [name] is an 52 y.o. female.   Chief Complaint: Patient is here for colonoscopy. HPI: This 52 year old Caucasian female who is here for screening colonoscopy.  She denies abdominal pain change in bowel habits or rectal bleeding. Family history is negative for CRC.  Past Medical History:  Diagnosis Date  .  09/16/2014  .  09/16/2014  . UTI (lower urinary tract infection) 09/16/2014  . Vaginal Pap smear, abnormal     Past Surgical History:  Procedure Laterality Date  . BELPHAROPTOSIS REPAIR  1969  . CERVIX LESION DESTRUCTION    . PTOSIS REPAIR Bilateral 08/03/2017   Procedure: INTERNAL PTOSIS REPAIR BILATERAL;  Surgeon: Floydene Flock, MD;  Location: [facility] OR;  Service: Ophthalmology;  Laterality: Bilateral;  . THORACOTOMY  2005  . TONSILLECTOMY     and adenoidectomy  . vuvular repair      Family History  Problem Relation Age of Onset  . COPD Mother   . Osteoporosis Mother   . Hypertension Father   . Hypertension Sister   . Stroke Maternal Grandmother   . Hypertension Maternal Grandmother   . Diabetes Maternal Grandmother   . Cancer Maternal Grandfather   . Heart attack Paternal Grandmother   . Kidney failure Paternal Grandfather    Social History:  reports that she has quit smoking. Her smoking use included cigarettes. She has never used smokeless tobacco. She reports that she drinks alcohol. She reports that she does not use drugs.  Allergies:  Allergies  Allergen Reactions  . Neosporin [Neomycin-Bacitracin Zn-Polymyx] Itching and Swelling    Medications Prior to Admission  Medication Sig Dispense Refill  . Cholecalciferol (VITAMIN D3) 5000 units CAPS Take 5,000 Units by mouth 2 (two) times a week.     . loratadine (CLARITIN) 10 MG tablet Take 10 mg by mouth daily as needed for allergies.    . Multiple Vitamin (MULTIVITAMIN) tablet Take 1 tablet by mouth 2 (two) times a week.     Marland Kitchen Propylene Glycol (SYSTANE COMPLETE) 0.6 % SOLN Place 1 drop into both eyes 2 (two)  times daily.     . calcium elemental as carbonate (TUMS ULTRA 1000) 400 MG chewable tablet Chew 1,000 mg by mouth daily as needed for heartburn.    Marland Kitchen HYDROcodone-acetaminophen (NORCO) 5-325 MG tablet Take 2 tablets by mouth every 6 (six) hours as needed for moderate pain. (Patient not taking: Reported on 10/18/2017) 12 tablet 0  . ibuprofen (ADVIL) 200 MG tablet Take 400 mg by mouth daily as needed for headache or moderate pain.     Marland Kitchen triamcinolone (KENALOG) 0.1 % paste Use as directed 1 application in the mouth or throat daily as needed (mouth ulcers).       No results found for this or any previous visit (from the past 48 hour(s)). No results found.  ROS  Blood pressure (!) 108/47, pulse 64, temperature 98.2 F (36.8 C), temperature source Oral, resp. rate 13, height 5' 3.5" (1.613 m), weight 72.6 kg, SpO2 (!) 2 %. Physical Exam  Constitutional: She appears well-developed and well-nourished.  HENT:  Mouth/Throat: Oropharynx is clear and moist.  Eyes: Conjunctivae are normal. No scleral icterus.  Neck: No thyromegaly present.  Cardiovascular: Normal rate, regular rhythm and normal heart sounds.  No murmur heard. Respiratory: Effort normal and breath sounds normal.  GI: Soft. She exhibits no distension and no mass. There is no tenderness.  Musculoskeletal: She exhibits no edema.  Lymphadenopathy:    She has no cervical adenopathy.  Neurological: She is alert.  Skin: Skin is warm and dry.     Assessment/Plan Average risk screening colonoscopy  Lionel December, MD 10/25/2017, 7:34 AM

## 2017-10-30 ENCOUNTER — Encounter (HOSPITAL_COMMUNITY): Payer: Self-pay | Admitting: Internal Medicine

## 2018-03-23 MED FILL — XIIDRA 5% EYE DROPS: 5 | 90 days supply | Qty: 180 | Fill #0

## 2018-06-12 ENCOUNTER — Other Ambulatory Visit (HOSPITAL_COMMUNITY): Payer: Self-pay | Admitting: Internal Medicine

## 2018-06-12 DIAGNOSIS — Z1231 Encounter for screening mammogram for malignant neoplasm of breast: Secondary | ICD-10-CM

## 2018-08-10 MED FILL — XIIDRA 5% EYE DROPS: 5 | 90 days supply | Qty: 180 | Fill #1

## 2018-09-18 ENCOUNTER — Other Ambulatory Visit (HOSPITAL_COMMUNITY): Payer: Self-pay | Admitting: Internal Medicine

## 2018-09-18 DIAGNOSIS — Z1231 Encounter for screening mammogram for malignant neoplasm of breast: Secondary | ICD-10-CM

## 2018-10-02 ENCOUNTER — Telehealth (INDEPENDENT_AMBULATORY_CARE_PROVIDER_SITE_OTHER): Payer: Self-pay

## 2018-10-02 ENCOUNTER — Other Ambulatory Visit (INDEPENDENT_AMBULATORY_CARE_PROVIDER_SITE_OTHER): Payer: Self-pay

## 2018-10-02 DIAGNOSIS — K649 Unspecified hemorrhoids: Secondary | ICD-10-CM

## 2018-10-03 ENCOUNTER — Other Ambulatory Visit: Payer: Self-pay

## 2018-10-03 ENCOUNTER — Ambulatory Visit (HOSPITAL_COMMUNITY)
Admission: RE | Admit: 2018-10-03 | Discharge: 2018-10-03 | Disposition: A | Discharge status: Home / Self Care | Payer: No Typology Code available for payment source | Source: Ambulatory Visit | Attending: Internal Medicine | Admitting: Internal Medicine

## 2018-10-03 ENCOUNTER — Encounter (INDEPENDENT_AMBULATORY_CARE_PROVIDER_SITE_OTHER): Payer: Self-pay | Admitting: Internal Medicine

## 2018-10-03 DIAGNOSIS — Z1231 Encounter for screening mammogram for malignant neoplasm of breast: Secondary | ICD-10-CM | POA: Diagnosis not present

## 2018-10-03 NOTE — Telephone Encounter (Signed)
Pt was called and given and sent referral to GI

## 2018-10-05 ENCOUNTER — Ambulatory Visit (INDEPENDENT_AMBULATORY_CARE_PROVIDER_SITE_OTHER): Payer: No Typology Code available for payment source | Admitting: Gastroenterology

## 2018-10-05 ENCOUNTER — Other Ambulatory Visit: Payer: Self-pay

## 2018-10-05 ENCOUNTER — Encounter: Payer: Self-pay | Admitting: Gastroenterology

## 2018-10-05 VITALS — BP 106/61 | HR 67 | Temp 97.0°F | Ht 63.5 in | Wt 152.4 lb

## 2018-10-05 DIAGNOSIS — K641 Second degree hemorrhoids: Secondary | ICD-10-CM | POA: Diagnosis not present

## 2018-10-05 NOTE — Patient Instructions (Signed)
Continue to limit your toilet time and avoid straining as you are doing.  Please call me with any issues in the meantime!  You can use over-the-counter lidocaine ointment per rectum as needed.   It will be normal to feel pressure and possible mild cramping today. Please call if this changes!  We will make a slot for you on 9/28 at 4pm.   See you next time!  It was a pleasure to see you today. I want to create trusting relationships with patients to provide genuine, compassionate, and quality care. I value your feedback. If you receive a survey regarding your visit,  I greatly appreciate you taking time to fill this out.   Annitta Needs, PhD, ANP-BC [facility] Gastroenterology

## 2018-10-05 NOTE — Progress Notes (Signed)
   [place] Banding Note:   Shelby [name] is a 53 y.o. female presenting today as self-referral due to hemorrhoid exacerbation. She had a routine screening colonoscopy recently in Oct 2019 with multiple small and large-mouthed diverticula in sigmoid colon, external hemorrhoids on retroflexion that were small. She reports a long-standing history of hemorrhoid issues, dating back to her early 64s. Every 2-3 years she will have a flare, with itching, pressure, prolapsing with spontaneous reduction, and will need to provide supportive care for several weeks until resolved. She has no constipation, straining, and limits toilet time to 2-3 minutes. She is on no anticoagulation. She has dealt with these flares for years and recently has recovered from prolonged exacerbation that was slow to respond to proctofoam and homemade remedies. She would like to pursue treatment of hemorrhoids. She has been well-versed in the procedure, and she is an NP by profession. She has maximized medical therapy and behavior/dietary modifications.   The patient presents with symptomatic grade 2 hemorrhoids, unresponsive to maximal medical therapy, requesting rubber band ligation of her hemorrhoidal disease. All risks, benefits, and alternative forms of therapy were described and informed consent was obtained.  In the left lateral decubitus position anoscopic examination revealed grade 2 hemorrhoids, with most prominent in the left lateral, followed by both right anterior and right posterior.   The decision was made to band the left lateral internal hemorrhoid, and the [place] was used to perform band ligation without complication. Digital anorectal examination was then performed to assure proper positioning of the band, and no adjustment was required. The patient was discharged home without pain or other issues. Dietary and behavioral recommendations were given, along with follow-up instructions. The patient will return Sept 28th  at 4pm  for followup and possible additional banding as required.  No complications were encountered and the patient tolerated the procedure well  Shelby Needs, PhD, [facility] Gastroenterology

## 2018-10-08 ENCOUNTER — Encounter: Payer: Self-pay | Admitting: Gastroenterology

## 2018-10-09 ENCOUNTER — Encounter: Payer: No Typology Code available for payment source | Admitting: Gastroenterology

## 2018-10-22 ENCOUNTER — Encounter: Payer: Self-pay | Admitting: Gastroenterology

## 2018-10-22 ENCOUNTER — Other Ambulatory Visit: Payer: Self-pay

## 2018-10-22 ENCOUNTER — Ambulatory Visit (INDEPENDENT_AMBULATORY_CARE_PROVIDER_SITE_OTHER): Payer: No Typology Code available for payment source | Admitting: Gastroenterology

## 2018-10-22 VITALS — BP 103/60 | HR 69 | Temp 97.1°F | Ht 63.5 in | Wt 152.0 lb

## 2018-10-22 DIAGNOSIS — K641 Second degree hemorrhoids: Secondary | ICD-10-CM | POA: Diagnosis not present

## 2018-10-22 NOTE — Patient Instructions (Signed)
Continue to avoid straining, eat a high fiber diet, and limit toilet time to 2-3 minutes.  I will see you in a few weeks when it works well for you!  Annitta Needs, PhD, ANP-BC [facility] Gastroenterology

## 2018-10-22 NOTE — Progress Notes (Signed)
[facility] Banding Note:   53 year old female with history of hemorrhoids, s/p recent colonoscopy Oct 2019 with multiple small and large-mouthed diverticula in sigmoid colon, external hemorrhoids on retroflexion that were small. She reports a long-standing history of hemorrhoid issues, dating back to her early 62s. Every 2-3 years she will have a flare, with itching, pressure, prolapsing with spontaneous reduction, and will need to provide supportive care for several weeks until resolved. She has no constipation, straining, and limits toilet time to 2-3 minutes. She is on no anticoagulation. She has dealt with these flares for years and recently has recovered from prolonged exacerbation that was slow to respond to proctofoam and homemade remedies. Her first banding was several weeks ago, where the left lateral was banded. She notes improvement in symptoms but still not resolved.    The patient presents with symptomatic grade 2 hemorrhoids, unresponsive to maximal medical therapy, requesting rubber band ligation of her hemorrhoidal disease. All risks, benefits, and alternative forms of therapy were described and informed consent was obtained.   The decision was made to band the right anterior nternal hemorrhoid, and the [place] was used to perform band ligation without complication. Digital anorectal examination was then performed to assure proper positioning of the band, and no adjustment was required. The patient was discharged home without pain or other issues. Dietary and behavioral recommendations were given. The patient will return in 3-4 weeks for followup and possible additional banding as required.  No complications were encountered and the patient tolerated the procedure well.  Annitta Needs, PhD, ANP-BC [facility] Gastroenterology

## 2018-10-23 ENCOUNTER — Encounter: Payer: Self-pay | Admitting: Gastroenterology

## 2018-11-20 ENCOUNTER — Ambulatory Visit: Payer: No Typology Code available for payment source | Admitting: Gastroenterology

## 2018-11-22 ENCOUNTER — Ambulatory Visit (INDEPENDENT_AMBULATORY_CARE_PROVIDER_SITE_OTHER): Payer: No Typology Code available for payment source | Admitting: Nurse Practitioner

## 2018-11-22 ENCOUNTER — Encounter (INDEPENDENT_AMBULATORY_CARE_PROVIDER_SITE_OTHER): Payer: Self-pay | Admitting: Nurse Practitioner

## 2018-11-22 ENCOUNTER — Other Ambulatory Visit: Payer: Self-pay

## 2018-11-22 VITALS — BP 102/76 | HR 62 | Temp 98.3°F | Resp 14 | Ht 63.0 in | Wt 148.0 lb

## 2018-11-22 DIAGNOSIS — Z13220 Encounter for screening for lipoid disorders: Secondary | ICD-10-CM | POA: Diagnosis not present

## 2018-11-22 DIAGNOSIS — Z139 Encounter for screening, unspecified: Secondary | ICD-10-CM

## 2018-11-22 DIAGNOSIS — Z130 Encounter for screening for diseases of the blood and blood-forming organs and certain disorders involving the immune mechanism: Secondary | ICD-10-CM | POA: Insufficient documentation

## 2018-11-22 DIAGNOSIS — Z0001 Encounter for general adult medical examination with abnormal findings: Secondary | ICD-10-CM

## 2018-11-22 DIAGNOSIS — E559 Vitamin D deficiency, unspecified: Secondary | ICD-10-CM | POA: Insufficient documentation

## 2018-11-22 DIAGNOSIS — R5383 Other fatigue: Secondary | ICD-10-CM | POA: Insufficient documentation

## 2018-11-22 DIAGNOSIS — Z131 Encounter for screening for diabetes mellitus: Secondary | ICD-10-CM | POA: Diagnosis not present

## 2018-11-22 DIAGNOSIS — Z1329 Encounter for screening for other suspected endocrine disorder: Secondary | ICD-10-CM

## 2018-11-22 NOTE — Patient Instructions (Signed)
Thank you for choosing [facility] as your medical provider! If you have any questions or concerns regarding your health care, please do not hesitate to call our office.  Things we talked about today: 1.  Immunizations: If you would like any of the recommended immunizations including flu, tetanus, shingles.  Please notify this office. 2.  Health maintenance: Pap smear will be due again in April 2022, colonoscopy will be due again in October 2029, you have completed sexual transmitted infection screening.  Depression screen today was negative.  Mammogram will be due again in September 2022. 3.  Blood work: I will collect blood work today including lipid panel, CBC, CMP, A1c, TSH level, and vitamin D.  I will release these results to MyChart, but if you have any questions or concerns do not hesitate to contact me through MyChart or you can call the office.  Please follow-up as scheduled in 1 year. We look forward to seeing you again soon! Have a great Halloween!!  At [facility] we value your feedback. You may receive a survey about your visit today. Please share your experience as we strive to create trusting relationships with our patients to provide genuine, compassionate, quality care.  We appreciate your understanding and patience as we review any laboratory studies, imaging, and other diagnostic tests that are ordered as we care for you. We do our best to address any and all results in a timely manner. If you do not hear about test results within 1 week, please do not hesitate to contact us. If we referred you to a specialist during your visit or ordered imaging testing, contact the office if you have not been contacted to be scheduled within 1 weeks.  We also encourage the use of MyChart, which contains your medical information for your review as well. If you are not enrolled in this feature, an access code is on this after visit summary for your convenience. Thank you for  allowing [place] to be involved in your care.

## 2018-11-22 NOTE — Progress Notes (Signed)
Subjective:  Patient ID: Shelby [name], female    DOB: 05/01/1965  Age: [age] y.o. MRN: [id number]  CC:  Chief Complaint  Patient presents with   Annual Exam      HPI  This patient comes in today for her annual physical exam.  We did discuss health maintenance recommendations.  This includes immunizations and she is not interested in being immunized against the flu, tetanus, or shingles at this time.  We also discussed her health maintenance screening recommendations.  She is up-to-date with cervical cancer screening, colon cancer screening, STI screening, and breast cancer screening.  She is due for depression screening today.  And she is not interested in being screened for hepatitis C at this time.  Overall she tells me she has been feeling well.  She has been working on her emotional, spiritual, financial, and physical health and has been making great strides in these domains.  She continues on all medications as listed below.  She does have a history of fatigue in the past, but this seems to be much improved.  Past Medical History:  Diagnosis Date   Hematuria 09/16/2014   URI (upper respiratory infection)    Urinary frequency 09/16/2014   UTI (lower urinary tract infection) 09/16/2014   Vaginal Pap smear, abnormal     Health Maintenance  Topic Date Due   INFLUENZA VACCINE  04/24/2019 (Originally 08/25/2018)   TETANUS/TDAP  04/24/2020 (Originally 04/27/1984)   PAP SMEAR-Modifier  05/04/2020   MAMMOGRAM  10/02/2020   COLONOSCOPY  10/26/2027   HIV Screening  Discontinued    Additional Screenings/Health maintenance: 1. Sexually Transmitted Infection Screening: Completed in the past.  Not sure of exact date. 2. Diabetic Screening: She is due for this today this will be part of her blood work. 3. Hepatitis C Screening: Will not be done today per patient preference.  Family History  Problem Relation Age of Onset   COPD Mother    Osteoporosis Mother     Hypertension Mother    Hypertension Father    Hypertension Sister    Anxiety disorder Sister    Stroke Maternal Grandmother    Hypertension Maternal Grandmother    Diabetes Maternal Grandmother    Cancer Maternal Grandfather    Heart attack Paternal Grandmother    Kidney failure Paternal Grandfather     Social History   Social History Narrative   Not on file   Social History   Tobacco Use   Smoking status: Former Smoker    Packs/day: 1.00    Years: 8.00    Pack years: 8.00    Types: Cigarettes   Smokeless tobacco: Never Used   Tobacco comment: quit in 1996  Substance Use Topics   Alcohol use: Yes    Comment: 3 ounces every 3 weeks     Current Meds  Medication Sig   calcium elemental as carbonate (TUMS ULTRA 1000) 400 MG chewable tablet Chew 1,000 mg by mouth daily as needed for heartburn.   Cholecalciferol (VITAMIN D3) 5000 units CAPS Take 5,000 Units by mouth daily.    ibuprofen (ADVIL) 200 MG tablet Take 400 mg by mouth daily as needed for headache or moderate pain.    loratadine (CLARITIN) 10 MG tablet Take 10 mg by mouth daily as needed for allergies.   Multiple Vitamin (MULTIVITAMIN) tablet Take 1 tablet by mouth daily.    Propylene Glycol (SYSTANE COMPLETE) 0.6 % SOLN Place 1 drop into both eyes 2 (two) times daily.  triamcinolone (KENALOG) 0.1 % paste Use as directed 1 application in the mouth or throat daily as needed (mouth ulcers).     ROS:  Review of Systems  Constitutional: Positive for weight loss (intentional). Negative for chills and fever.  HENT: Negative for hearing loss.   Eyes: Negative for blurred vision (Has dry eye uses drops) and double vision.  Respiratory: Negative for cough, shortness of breath and wheezing.   Cardiovascular: Negative for chest pain, palpitations and orthopnea.  Gastrointestinal: Negative for blood in stool, constipation, diarrhea, nausea and vomiting.  Genitourinary: Negative for dysuria, frequency,  hematuria and urgency.  Musculoskeletal: Negative for back pain, myalgias and neck pain.  Skin: Negative for itching and rash.  Neurological: Negative for dizziness, tingling, sensory change and weakness.  Psychiatric/Behavioral: Negative for depression, substance abuse and suicidal ideas. The patient is not nervous/anxious.      Objective:   Today's Vitals: BP 102/76    Pulse 62    Temp 98.3 F (36.8 C)    Resp 14    Ht 5\' 3"  (1.6 m)    Wt 148 lb (67.1 kg)    SpO2 98%    BMI 26.22 kg/m  Vitals with BMI 11/22/2018 10/22/2018 10/05/2018  Height 5\' 3"  5' 3.5" 5' 3.5"  Weight 148 lbs 152 lbs 152 lbs 6 oz  BMI 26.22 26.5 26.57  Systolic 102 103 12/05/2018  Diastolic 76 60 61  Pulse 62 69 67     Physical Exam Vitals signs reviewed.  Constitutional:      Appearance: Normal appearance.  HENT:     Head: Normocephalic and atraumatic.     Right Ear: Tympanic membrane, ear canal and external ear normal.     Left Ear: Tympanic membrane, ear canal and external ear normal.  Eyes:     General:        Right eye: No discharge.        Left eye: No discharge.     Extraocular Movements: Extraocular movements intact.     Conjunctiva/sclera: Conjunctivae normal.     Pupils: Pupils are equal, round, and reactive to light.  Neck:     Musculoskeletal: Neck supple. No muscular tenderness.     Vascular: No carotid bruit.  Cardiovascular:     Rate and Rhythm: Normal rate and regular rhythm.     Pulses: Normal pulses.     Heart sounds: Normal heart sounds. No murmur.  Pulmonary:     Effort: Pulmonary effort is normal.     Breath sounds: Normal breath sounds.  Chest:     Breasts: Breasts are symmetrical.        Right: Inverted nipple (Only with palpation of nipple; most recent mammogram was wnll) present.        Left: Normal.  Abdominal:     General: Abdomen is flat. Bowel sounds are normal. There is no distension.     Palpations: Abdomen is soft. There is no mass.     Tenderness: There is no  abdominal tenderness.  Musculoskeletal:        General: No tenderness.     Right lower leg: No edema.     Left lower leg: No edema.  Lymphadenopathy:     Cervical: No cervical adenopathy.     Upper Body:     Right upper body: No supraclavicular adenopathy.     Left upper body: No supraclavicular adenopathy.  Skin:    General: Skin is warm and dry.  Neurological:     General:  No focal deficit present.     Mental Status: She is alert and oriented to person, place, and time.     Motor: No weakness.     Gait: Gait normal.  Psychiatric:        Mood and Affect: Mood normal.        Behavior: Behavior normal.        Judgment: Judgment normal.          Assessment   1. Encounter for general adult medical examination with abnormal findings   2. Screening for diabetes mellitus   3. Screening, lipid   4. Screening for condition   5. Screening for deficiency anemia   6. Thyroid disorder screen   7. Fatigue, unspecified type   8. Vitamin D deficiency   9. Encounter for screening, unspecified   10. Lipid screening       Tests ordered Orders Placed This Encounter  Procedures   CBC   CMP   Lipid Panel   Hemoglobin A1c   Vitamin D, 25-hydroxy   TSH     Plan: Overall patient is doing well today.  I will collect blood work today for regular screenings as well as to monitor her vitamin D level as she is taking a supplement daily.  Physical exam appeared grossly normal.  She will follow-up with other specialist including her dermatologist as scheduled.  She was encouraged to call this office with any questions or concerns prior to her next appointment.  I will notify her of her blood results via MyChart.   No orders of the defined types were placed in this encounter.   Patient to follow-up in 1 year, for additional annual physical exam.  She was encouraged to call the office with any questions or concerns prior to this upcoming appointment.  Ailene Ards, NP

## 2018-11-23 ENCOUNTER — Encounter (INDEPENDENT_AMBULATORY_CARE_PROVIDER_SITE_OTHER): Payer: Self-pay | Admitting: Nurse Practitioner

## 2018-11-23 LAB — HEMOGLOBIN A1C
Hgb A1c MFr Bld: 5.1 % of total Hgb (ref <5.7)
Mean Plasma Glucose: 100 (calc)
eAG (mmol/L): 5.5 (calc)

## 2018-11-23 LAB — LIPID PANEL
Cholesterol: 164 mg/dL (ref <200)
HDL: 62 mg/dL (ref >=50)
LDL Cholesterol (Calc): 85 mg/dL (calc)
Non-HDL Cholesterol (Calc): 102 mg/dL (calc) (ref <130)
Total CHOL/HDL Ratio: 2.6 (calc) (ref <5.0)
Triglycerides: 78 mg/dL (ref <150)

## 2018-11-23 LAB — TSH: TSH: 1.43 mIU/L

## 2018-11-23 LAB — COMPREHENSIVE METABOLIC PANEL
AG Ratio: 2.1 (calc) (ref 1.0–2.5)
ALT: 15 U/L (ref 6–29)
AST: 20 U/L (ref 10–35)
Albumin: 4.5 g/dL (ref 3.6–5.1)
Alkaline phosphatase (APISO): 69 U/L (ref 37–153)
BUN: 15 mg/dL (ref 7–25)
CO2: 26 mmol/L (ref 20–32)
Calcium: 9.7 mg/dL (ref 8.6–10.4)
Chloride: 104 mmol/L (ref 98–110)
Creat: 0.87 mg/dL (ref 0.50–1.05)
Globulin: 2.1 g/dL (calc) (ref 1.9–3.7)
Glucose, Bld: 81 mg/dL (ref 65–99)
Potassium: 4.1 mmol/L (ref 3.5–5.3)
Sodium: 141 mmol/L (ref 135–146)
Total Bilirubin: 0.5 mg/dL (ref 0.2–1.2)
Total Protein: 6.6 g/dL (ref 6.1–8.1)

## 2018-11-23 LAB — CBC
HCT: 43.3 % (ref 35.0–45.0)
Hemoglobin: 14.4 g/dL (ref 11.7–15.5)
MCH: 30.6 pg (ref 27.0–33.0)
MCHC: 33.3 g/dL (ref 32.0–36.0)
MCV: 92.1 fL (ref 80.0–100.0)
MPV: 12.7 fL — ABNORMAL HIGH (ref 7.5–12.5)
Platelets: 205 10*3/uL (ref 140–400)
RBC: 4.7 10*6/uL (ref 3.80–5.10)
RDW: 11.7 % (ref 11.0–15.0)
WBC: 5.6 10*3/uL (ref 3.8–10.8)

## 2018-11-23 LAB — VITAMIN D 25 HYDROXY (VIT D DEFICIENCY, FRACTURES): Vit D, 25-Hydroxy: 52 ng/mL (ref 30–100)

## 2018-11-23 NOTE — Progress Notes (Signed)
All results reviewed, no changes to medications or addition of new medications necessary at this time.  Patient encouraged to follow-up as scheduled, or call with any questions or concerns prior to her next appointment.

## 2018-11-30 MED FILL — XIIDRA 5% EYE DROPS: 5 | 90 days supply | Qty: 180 | Fill #2

## 2018-12-04 ENCOUNTER — Other Ambulatory Visit: Payer: Self-pay

## 2018-12-04 ENCOUNTER — Ambulatory Visit (INDEPENDENT_AMBULATORY_CARE_PROVIDER_SITE_OTHER): Payer: No Typology Code available for payment source | Admitting: Gastroenterology

## 2018-12-04 ENCOUNTER — Encounter: Payer: Self-pay | Admitting: Gastroenterology

## 2018-12-04 VITALS — BP 98/59 | HR 65 | Temp 97.1°F | Ht 63.0 in | Wt 147.0 lb

## 2018-12-04 DIAGNOSIS — K641 Second degree hemorrhoids: Secondary | ICD-10-CM

## 2018-12-04 NOTE — Progress Notes (Signed)
[facility] Banding Note:   53 year old female with history of hemorrhoids, s/p recent colonoscopy Oct 2019 with multiple small and large-mouthed diverticula in sigmoid colon, external hemorrhoids on retroflexion that were small.She reports a long-standing history of hemorrhoid issues, dating back to her early 108s. Every 2-3 years she will have a flare, with itching, pressure, prolapsing with spontaneous reduction, and will need to provide supportive care for several weeks until resolved. She has no constipation, straining, and limits toilet time to 2-3 minutes. She is on no anticoagulation. She has dealt with these flares for years and recently has recovered from prolonged exacerbation that was slow to respond to proctofoam and homemade remedies. Thus far, she has had banding of left lateral, right anterior. Returns today with improvement overall and desires banding of final hemorrhoid column.   The patient presents with symptomatic grade 2 hemorrhoids, unresponsive to maximal medical therapy, requesting rubber band ligation of her hemorrhoidal disease. All risks, benefits, and alternative forms of therapy were described and informed consent was obtained.  The decision was made to band the right posterior internal hemorrhoid, and the [place] was used to perform band ligation without complication. Digital anorectal examination was then performed to assure proper positioning of the band, and no adjustment was required.The patient was discharged home without pain or other issues. Dietary and behavioral recommendations were given, along with follow-up instructions.  No complications were encountered and the patient tolerated the procedure well  Annitta Needs, PhD, [facility]

## 2018-12-04 NOTE — Patient Instructions (Signed)
Continue to avoid straining, eating healthy as you are doing.  I will see you back in as needed!  I enjoyed seeing you again today! As you know, I value our relationship and want to provide genuine, compassionate, and quality care. I welcome your feedback. If you receive a survey regarding your visit,  I greatly appreciate you taking time to fill this out. See you next time!  Annitta Needs, PhD, ANP-BC [facility] Gastroenter[facility]

## 2019-02-25 MED FILL — XIIDRA 5 % SOLN: 5 | 90 days supply | Qty: 180 | Fill #0

## 2019-05-01 ENCOUNTER — Encounter (INDEPENDENT_AMBULATORY_CARE_PROVIDER_SITE_OTHER): Payer: Self-pay | Admitting: Internal Medicine

## 2019-05-27 ENCOUNTER — Encounter (INDEPENDENT_AMBULATORY_CARE_PROVIDER_SITE_OTHER): Payer: Self-pay | Admitting: Nurse Practitioner

## 2019-05-27 ENCOUNTER — Ambulatory Visit (INDEPENDENT_AMBULATORY_CARE_PROVIDER_SITE_OTHER): Payer: No Typology Code available for payment source | Admitting: Nurse Practitioner

## 2019-05-27 ENCOUNTER — Other Ambulatory Visit: Payer: Self-pay

## 2019-05-27 VITALS — BP 115/70 | HR 86 | Temp 97.7°F | Ht 63.5 in | Wt 141.2 lb

## 2019-05-27 DIAGNOSIS — T7840XA Allergy, unspecified, initial encounter: Secondary | ICD-10-CM

## 2019-05-27 MED ORDER — TRIAMCINOLONE ACETONIDE 0.025 % EX OINT: 1.0000 "application " | TOPICAL_OINTMENT | Freq: Two times a day (BID) | CUTANEOUS | 0 refills | Status: DC

## 2019-05-27 MED ORDER — PREDNISONE 20 MG PO TABS: 40.0000 mg | ORAL_TABLET | Freq: Every day | ORAL | 0 refills | Status: DC

## 2019-05-27 MED ORDER — EPINEPHRINE 0.3 MG/0.3ML IJ SOAJ: 0.3000 mg | INTRAMUSCULAR | 1 refills | Status: DC | PRN

## 2019-05-27 MED FILL — TRIAMCINOLONE 0.025% OINT: 0.025 | 14 days supply | Qty: 30 | Fill #0

## 2019-05-27 MED FILL — EPINEPHRINE 0.3 MG AUTO-INJ: 0.3 | 2 days supply | Qty: 2 | Fill #0

## 2019-05-27 MED FILL — predniSONE 20 MG TABS: 20 | 5 days supply | Qty: 10 | Fill #0

## 2019-05-27 NOTE — Progress Notes (Signed)
Subjective:  Patient ID: Shelby [name], female    DOB: [birth year]  Age: 54 y.o. MRN: [id number]  CC:  Chief Complaint  Patient presents with  . tick bite over the weekend    in the crease of her leg   . lips swelled      HPI  This patient arrives for an acute visit for the above.  She tells me that last night around 8:30 PM she noticed that her lower lip was swelling.  By 930 or 10 PM, her upper lip with swelling.  She took some Benadryl which seemed to help her symptoms but only mildly.  She also noted that she had a tick on her yesterday evening as well and remove this.  She looked online to see if she could I do the tick and tells me she thinks it was a [employer] tick.  She tells me right before the lip swelling started she ate salmon, asparagus, potatoes, sweet potato casserole, and all of oil.  As of right now, the swelling has improved and she tells me that she feels about 90% better.  She denies any chest pain, tongue swelling, or difficulty with her breathing.  She has not noted any rash.  She has noted some pruritus to her ears and upper thigh area.  She is trying to avoid scratching.  She tells me she does have multiple environmental allergies, but is not allergic to any of the foods that she ate right before her reaction occurred.  She does have the tick with her today.  Per CDC tick ID, it does appear to be a [place] tick.   Past Medical History:  Diagnosis Date  . Hematuria 09/16/2014  . URI (upper respiratory infection)   . Urinary frequency 09/16/2014  . UTI (lower urinary tract infection) 09/16/2014  . Vaginal Pap smear, abnormal       Family History  Problem Relation Age of Onset  . COPD Mother   . Osteoporosis Mother   . Hypertension Mother   . Hypertension Father   . Hypertension Sister   . Anxiety disorder Sister   . Stroke Maternal Grandmother   . Hypertension Maternal Grandmother   . Diabetes Maternal Grandmother   . Cancer Maternal Grandfather     . Heart attack Paternal Grandmother   . Kidney failure Paternal Grandfather     Social History   Social History Narrative  . Not on file   Social History   Tobacco Use  . Smoking status: Former Smoker    Packs/day: 1.00    Years: 8.00    Pack years: 8.00    Types: Cigarettes  . Smokeless tobacco: Never Used  . Tobacco comment: quit in 1996  Substance Use Topics  . Alcohol use: Yes    Comment: 3 ounces every 3 weeks     Current Meds  Medication Sig  . calcium elemental as carbonate (TUMS ULTRA 1000) 400 MG chewable tablet Chew 1,000 mg by mouth daily as needed for heartburn.  . Cholecalciferol (VITAMIN D3) 5000 units CAPS Take 5,000 Units by mouth daily.   [place] ibuprofen (ADVIL) 200 MG tablet Take 400 mg by mouth daily as needed for headache or moderate pain.   Mar[place] Kitchen loratadine (CLARITIN) 10 MG tablet Take 10 mg by mouth daily as needed for allergies.  . Multiple Vitamin (MULTIVITAMIN) tablet Take 1 tablet by mouth daily.   [place] Kitchen Propylene Glycol (SYSTANE COMPLETE) 0.6 % SOLN Place 1 drop into  both eyes 2 (two) times daily.   Marland Kitchen triamcinolone (KENALOG) 0.1 % paste Use as directed 1 application in the mouth or throat daily as needed (mouth ulcers).     ROS:  Reviewed and negative unless otherwise stated in HPI   Objective:   Today's Vitals: BP 115/70 (BP Location: Right Arm, Patient Position: Sitting, Cuff Size: Normal)   Pulse 86   Temp 97.7 F (36.5 C) (Temporal)   Ht 5' 3.5" (1.613 m)   Wt 141 lb 3.2 oz (64 kg)   SpO2 96%   BMI 24.62 kg/m  Vitals with BMI 05/27/2019 12/04/2018 11/22/2018  Height 5' 3.5" 5\' 3"  5\' 3"   Weight 141 lbs 3 oz 147 lbs 148 lbs  BMI 24.62 26.05 26.22  Systolic 115 98 102  Diastolic 70 59 76  Pulse 86 65 62     Physical Exam Vitals reviewed.  Constitutional:      General: She is not in acute distress.    Appearance: Normal appearance.  HENT:     Head: Normocephalic and atraumatic.     Mouth/Throat:     Lips: Pink.     Comments: Very  mild swelling noted mainly to upper lips. Cardiovascular:     Rate and Rhythm: Normal rate and regular rhythm.     Pulses: Normal pulses.     Heart sounds: Normal heart sounds.  Pulmonary:     Effort: Pulmonary effort is normal.     Breath sounds: Normal breath sounds.  Skin:    General: Skin is warm and dry.       Neurological:     General: No focal deficit present.     Mental Status: She is alert and oriented to person, place, and time.  Psychiatric:        Mood and Affect: Mood normal.        Behavior: Behavior normal.        Judgment: Judgment normal.          Assessment and Plan   1. Hypersensitivity, initial encounter      Plan: 1.  We did discuss that treatment for hypersensitivity reaction often includes steroids and antihistamines.  I offered to send her prescription strength hydroxyzine, but she tells me she would prefer to continue using Benadryl as her symptoms have much improved on this alone.  I will send prescription for Kenalog cream that she can apply to her ears and/or upper thigh as needed for itching.  I have also sent prescription for oral prednisone that I recommend she only take if her symptoms worsen.  I will also send a prescription for EpiPen that she can have available in case she experiences a similar but more severe reaction.  She is aware and somewhat familiar with anaphylactic allergic reactions.  I reminded her that if she feels she needs to use the EpiPen and does use it, that she should then follow-up in the emergency department.  I also offered to send her to an allergist to have allergy testing completed as were not sure what the causative agent for this episode was, she tells me she like to hold off for now.  I did recommend that if she tries any of the food that she had right prior to this episode that she should try to eat the food and isolation wait at least 2 hours to see how she tolerates it.  I did discuss that per CDC, the [place] tick  is associated with allergic reactions with  consumption of red meat, and if she would eat red meat and feel unwell or experiencing her symptoms she should notify me.  We also discussed that if she experiences feeling unwell or a rash in the next upcoming days that she should notify me at that time as well.  Per CDC the [employer] tick was associated with human ehrlichiosis and [place] tick associated rash illness which both can be treated via doxycycline.  She tells me she will notify me if she experiences feeling unwell or rash.   Tests ordered No orders of the defined types were placed in this encounter.     Meds ordered this encounter  Medications  . triamcinolone (KENALOG) 0.025 % ointment    Sig: Apply 1 application topically 2 (two) times daily.    Dispense:  30 g    Refill:  0    Order Specific Question:   Supervising Provider    Answer:   Hurshel Party C [[id number]]  . predniSONE (DELTASONE) 20 MG tablet    Sig: Take 2 tablets (40 mg total) by mouth daily with breakfast.    Dispense:  10 tablet    Refill:  0    Order Specific Question:   Supervising Provider    Answer:   Hurshel Party C [[id number]]  . EPINEPHrine (EPIPEN 2-PAK) 0.3 mg/0.3 mL IJ SOAJ injection    Sig: Inject 0.3 mLs (0.3 mg total) into the muscle as needed for anaphylaxis.    Dispense:  1 each    Refill:  1    Order Specific Question:   Supervising Provider    Answer:   Doree Albee [[id number]]    Patient to follow-up in as scheduled or sooner as needed. Ailene Ards, NP

## 2019-06-28 ENCOUNTER — Encounter (INDEPENDENT_AMBULATORY_CARE_PROVIDER_SITE_OTHER): Payer: Self-pay | Admitting: Nurse Practitioner

## 2019-07-01 ENCOUNTER — Other Ambulatory Visit (INDEPENDENT_AMBULATORY_CARE_PROVIDER_SITE_OTHER): Payer: Self-pay | Admitting: Nurse Practitioner

## 2019-07-01 DIAGNOSIS — Q159 Congenital malformation of eye, unspecified: Secondary | ICD-10-CM

## 2019-08-19 MED FILL — XIIDRA 5 % SOLN: 5 | 90 days supply | Qty: 180 | Fill #1

## 2019-11-08 ENCOUNTER — Other Ambulatory Visit: Payer: Self-pay

## 2019-11-08 ENCOUNTER — Other Ambulatory Visit: Payer: No Typology Code available for payment source

## 2019-11-08 DIAGNOSIS — Z20822 Contact with and (suspected) exposure to covid-19: Secondary | ICD-10-CM

## 2019-11-09 ENCOUNTER — Other Ambulatory Visit: Payer: Self-pay | Admitting: Nurse Practitioner

## 2019-11-09 ENCOUNTER — Telehealth (HOSPITAL_COMMUNITY): Payer: Self-pay | Admitting: Emergency Medicine

## 2019-11-09 ENCOUNTER — Encounter: Payer: Self-pay | Admitting: Nurse Practitioner

## 2019-11-09 DIAGNOSIS — U071 COVID-19: Secondary | ICD-10-CM

## 2019-11-09 DIAGNOSIS — Z6826 Body mass index (BMI) 26.0-26.9, adult: Secondary | ICD-10-CM

## 2019-11-09 LAB — SPECIMEN STATUS REPORT

## 2019-11-09 LAB — NOVEL CORONAVIRUS, NAA: SARS-CoV-2, NAA: DETECTED — AB

## 2019-11-09 LAB — SARS-COV-2, NAA 2 DAY TAT

## 2019-11-09 NOTE — Telephone Encounter (Cosign Needed)
Called pt and explained possible monoclonal antibody treatment. Sx started 10/13. Tested positive 10/15 at a [facility] testing site in [place]. Sx include shortness of breath with exertion, dry cough, fever, body aches, headache, flu like symptoms, and fatigue. Qualifying risk factors former smoker (smoked for 8 years). Pt interested in tx. Please call home number [phone].  Informed pt an APP will call back to possibly schedule an appointment.

## 2019-11-09 NOTE — Progress Notes (Signed)
I connected by phone with Shelby [name] on 11/09/2019 at 7:30 PM to discuss the potential use of a new treatment for mild to moderate COVID-19 viral infection in non-hospitalized patients.  This patient is a 54 y.o. female that meets the FDA criteria for Emergency Use Authorization of COVID monoclonal antibody casirivimab/imdevimab or bamlanivimab/eteseviamb.  Has a (+) direct SARS-CoV-2 viral test result  Has mild or moderate COVID-19   Is NOT hospitalized due to COVID-19  Is within 10 days of symptom onset  Has at least one of the high risk factor(s) for progression to severe COVID-19 and/or hospitalization as defined in EUA.  Specific high risk criteria : BMI > 25   I have spoken and communicated the following to the patient or parent/caregiver regarding COVID monoclonal antibody treatment:  1. FDA has authorized the emergency use for the treatment of mild to moderate COVID-19 in adults and pediatric patients with positive results of direct SARS-CoV-2 viral testing who are 85 years of age and older weighing at least 40 kg, and who are at high risk for progressing to severe COVID-19 and/or hospitalization.  2. The significant known and potential risks and benefits of COVID monoclonal antibody, and the extent to which such potential risks and benefits are unknown.  3. Information on available alternative treatments and the risks and benefits of those alternatives, including clinical trials.  4. Patients treated with COVID monoclonal antibody should continue to self-isolate and use infection control measures (e.g., wear mask, isolate, social distance, avoid sharing personal items, clean and disinfect "high touch" surfaces, and frequent handwashing) according to CDC guidelines.   5. The patient or parent/caregiver has the option to accept or refuse COVID monoclonal antibody treatment.  After reviewing this information with the patient, the patient has agreed to receive one of the available  covid 19 monoclonal antibodies and will be provided an appropriate fact sheet prior to infusion. Tollie Eth, NP 11/09/2019 7:30 PM

## 2019-11-10 ENCOUNTER — Ambulatory Visit (HOSPITAL_COMMUNITY)
Admission: RE | Admit: 2019-11-10 | Discharge: 2019-11-10 | Disposition: A | Discharge status: Home / Self Care | Payer: No Typology Code available for payment source | Source: Ambulatory Visit | Attending: Pulmonary Disease | Admitting: Pulmonary Disease

## 2019-11-10 DIAGNOSIS — U071 COVID-19: Secondary | ICD-10-CM | POA: Diagnosis not present

## 2019-11-10 DIAGNOSIS — Z6826 Body mass index (BMI) 26.0-26.9, adult: Secondary | ICD-10-CM | POA: Diagnosis not present

## 2019-11-10 MED ORDER — SODIUM CHLORIDE 0.9 % IV SOLN: Freq: Once | INTRAVENOUS | Status: AC

## 2019-11-10 MED ORDER — EPINEPHRINE 0.3 MG/0.3ML IJ SOAJ: 0.3000 mg | Freq: Once | INTRAMUSCULAR | Status: DC | PRN

## 2019-11-10 MED ORDER — ALBUTEROL SULFATE HFA 108 (90 BASE) MCG/ACT IN AERS: 2.0000 | INHALATION_SPRAY | Freq: Once | RESPIRATORY_TRACT | Status: DC | PRN

## 2019-11-10 MED ORDER — DIPHENHYDRAMINE HCL 50 MG/ML IJ SOLN: 50.0000 mg | Freq: Once | INTRAMUSCULAR | Status: DC | PRN

## 2019-11-10 MED ORDER — SODIUM CHLORIDE 0.9 % IV SOLN: INTRAVENOUS | Status: DC | PRN

## 2019-11-10 MED ORDER — FAMOTIDINE IN NACL 20-0.9 MG/50ML-% IV SOLN: 20.0000 mg | Freq: Once | INTRAVENOUS | Status: DC | PRN

## 2019-11-10 MED ORDER — METHYLPREDNISOLONE SODIUM SUCC 125 MG IJ SOLR: 125.0000 mg | Freq: Once | INTRAMUSCULAR | Status: DC | PRN

## 2019-11-10 NOTE — Progress Notes (Signed)
  Diagnosis: COVID-19  Physician:Dr Wright   Procedure: [facility] Med: bamlanivimab\etesevimab infusion - Provided patient with bamlanimivab\etesevimab fact sheet for patients, parents and caregivers prior to infusion.  Complications: No immediate complications noted.  Discharge: Discharged home   Nozomi Mettler W 11/10/2019  

## 2019-11-10 NOTE — Discharge Instructions (Signed)

## 2019-12-03 ENCOUNTER — Ambulatory Visit (INDEPENDENT_AMBULATORY_CARE_PROVIDER_SITE_OTHER): Payer: No Typology Code available for payment source | Admitting: Nurse Practitioner

## 2019-12-03 ENCOUNTER — Encounter (INDEPENDENT_AMBULATORY_CARE_PROVIDER_SITE_OTHER): Payer: Self-pay | Admitting: Nurse Practitioner

## 2019-12-03 ENCOUNTER — Other Ambulatory Visit: Payer: Self-pay

## 2019-12-03 VITALS — BP 112/68 | HR 85 | Temp 97.5°F | Ht 63.5 in | Wt 142.6 lb

## 2019-12-03 DIAGNOSIS — Z131 Encounter for screening for diabetes mellitus: Secondary | ICD-10-CM

## 2019-12-03 DIAGNOSIS — Z0001 Encounter for general adult medical examination with abnormal findings: Secondary | ICD-10-CM

## 2019-12-03 DIAGNOSIS — R5381 Other malaise: Secondary | ICD-10-CM

## 2019-12-03 DIAGNOSIS — Z1329 Encounter for screening for other suspected endocrine disorder: Secondary | ICD-10-CM | POA: Diagnosis not present

## 2019-12-03 DIAGNOSIS — Z13220 Encounter for screening for lipoid disorders: Secondary | ICD-10-CM | POA: Diagnosis not present

## 2019-12-03 DIAGNOSIS — Z1159 Encounter for screening for other viral diseases: Secondary | ICD-10-CM

## 2019-12-03 DIAGNOSIS — Z139 Encounter for screening, unspecified: Secondary | ICD-10-CM

## 2019-12-03 NOTE — Progress Notes (Signed)
Subjective:  Patient ID: Shelby [name], female    DOB: Dec 18, 1965  Age: 54 y.o. MRN: [id number]  CC:  Chief Complaint  Patient presents with  . Annual Exam    Doing okay      HPI  This patient arrives today for the above.  Overall she is feeling well.  She was diagnosed with Covid approximately 1 month ago, and she is continuing to recover from this.  She tells me she continues to have a cough with mild thin sputum production, and some residual malaise and fatigue.  Otherwise, she has no complaints today.  As far as health maintenance is concerned that she does not get the flu shot because she does not tolerate it well.  She is not electing to be vaccinated with the COVID-19 vaccine at this time, but may consider this in the future once her 90-day window of recovery from COVID-19 infection has passed.  She is up-to-date with Pap smear will be due again in April 2022.  Up-to-date with colon cancer screening and will be due again October 2029.  She is electing not to undergo sexual transmitted infection screening today.  She quit smoking tobacco in the 90s.  Last mammogram was completed in September 2020, she would like to hold off until September 2022 for repeat mammogram.  She is due for depression screening and hepatitis C screening today.  She is willing to undergo both screenings.  She is following with her eye doctor routinely and is scheduled to see a specialist for treatment of dry eyes next month.  Past Medical History:  Diagnosis Date  . Hematuria 09/16/2014  . URI (upper respiratory infection)   . Urinary frequency 09/16/2014  . UTI (lower urinary tract infection) 09/16/2014  . Vaginal Pap smear, abnormal       Family History  Problem Relation Age of Onset  . COPD Mother   . Osteoporosis Mother   . Hypertension Mother   . Hypertension Father   . Hypertension Sister   . Anxiety disorder Sister   . Stroke Maternal Grandmother   . Hypertension Maternal Grandmother   .  Diabetes Maternal Grandmother   . Cancer Maternal Grandfather   . Heart attack Paternal Grandmother   . Kidney failure Paternal Grandfather     Social History   Social History Narrative  . Not on file   Social History   Tobacco Use  . Smoking status: Former Smoker    Packs/day: 1.00    Years: 8.00    Pack years: 8.00    Types: Cigarettes  . Smokeless tobacco: Never Used  . Tobacco comment: quit in 1996  Substance Use Topics  . Alcohol use: Yes    Comment: 3 ounces every 3 weeks     Current Meds  Medication Sig  . calcium elemental as carbonate (TUMS ULTRA 1000) 400 MG chewable tablet Chew 1,000 mg by mouth daily as needed for heartburn.  . Cholecalciferol (VITAMIN D3) 5000 units CAPS Take 5,000 Units by mouth daily.   Marland Kitchen EPINEPHrine (EPIPEN 2-PAK) 0.3 mg/0.3 mL IJ SOAJ injection Inject 0.3 mLs (0.3 mg total) into the muscle as needed for anaphylaxis.  Marland Kitchen ibuprofen (ADVIL) 200 MG tablet Take 400 mg by mouth daily as needed for headache or moderate pain.   Marland Kitchen loratadine (CLARITIN) 10 MG tablet Take 10 mg by mouth daily as needed for allergies.  . Multiple Vitamin (MULTIVITAMIN) tablet Take 1 tablet by mouth daily.   Marland Kitchen Propylene Glycol (  SYSTANE COMPLETE) 0.6 % SOLN Place 1 drop into both eyes 2 (two) times daily.   Marland Kitchen triamcinolone (KENALOG) 0.025 % ointment Apply 1 application topically 2 (two) times daily.  Marland Kitchen triamcinolone (KENALOG) 0.1 % paste Use as directed 1 application in the mouth or throat daily as needed (mouth ulcers).   Marland Kitchen XIIDRA 5 % SOLN INSTILL 1 DROP IN BOTH EYES TWO TIMES DAILY    ROS:  Review of Systems  Constitutional: Positive for malaise/fatigue.  Eyes:       (+) dry eye  Respiratory: Positive for cough and sputum production.   Cardiovascular: Negative.   Gastrointestinal: Negative.   Musculoskeletal: Negative.   Neurological: Negative.   Psychiatric/Behavioral: Positive for depression (mild apathy intermittently).     Objective:   Today's Vitals:  BP 112/68   Pulse 85   Temp (!) 97.5 F (36.4 C) (Temporal)   Ht 5' 3.5" (1.613 m)   Wt 142 lb 9.6 oz (64.7 kg)   SpO2 98%   BMI 24.86 kg/m  Vitals with BMI 12/03/2019 11/10/2019 11/10/2019  Height 5' 3.5" - -  Weight 142 lbs 10 oz - -  BMI 52.84 - -  Systolic 132 440 102  Diastolic 68 45 73  Pulse 85 80 92     Physical Exam Vitals reviewed.  Constitutional:      Appearance: Normal appearance.  HENT:     Head: Normocephalic and atraumatic.     Right Ear: Tympanic membrane, ear canal and external ear normal.     Left Ear: Tympanic membrane, ear canal and external ear normal.  Eyes:     General:        Right eye: No discharge.        Left eye: No discharge.     Extraocular Movements: Extraocular movements intact.     Conjunctiva/sclera: Conjunctivae normal.     Pupils: Pupils are equal, round, and reactive to light.   Neck:     Vascular: No carotid bruit.  Cardiovascular:     Rate and Rhythm: Normal rate and regular rhythm.     Pulses: Normal pulses.     Heart sounds: Normal heart sounds. No murmur heard.   Pulmonary:     Effort: Pulmonary effort is normal.     Breath sounds: Normal breath sounds.  Chest:     Breasts: Breasts are symmetrical.        Right: Normal.        Left: Normal.  Abdominal:     General: Abdomen is flat. Bowel sounds are normal. There is no distension.     Palpations: Abdomen is soft. There is no mass.     Tenderness: There is no abdominal tenderness.  Musculoskeletal:        General: No tenderness.     Cervical back: Neck supple. No muscular tenderness.     Right lower leg: No edema.     Left lower leg: No edema.  Lymphadenopathy:     Cervical: No cervical adenopathy.     Upper Body:     Right upper body: No supraclavicular adenopathy.     Left upper body: No supraclavicular adenopathy.  Skin:    General: Skin is warm and dry.  Neurological:     General: No focal deficit present.     Mental Status: She is alert and oriented to  person, place, and time.     Motor: No weakness.     Gait: Gait normal.  Psychiatric:  Mood and Affect: Mood normal.        Behavior: Behavior normal.        Judgment: Judgment normal.       PHQ9 SCORE ONLY 12/03/2019 05/27/2019 11/22/2018  PHQ-9 Total Score 0 0 0      Assessment and Plan   1. Encounter for general adult medical examination with abnormal findings   2. Screening, lipid   3. Screening for diabetes mellitus   4. Thyroid disorder screen   5. Encounter for screening, unspecified   6. Malaise   7. Encounter for hepatitis C screening test for low risk patient      Plan: 1.-- 7.  Depression screening negative today, will screen for hepatitis C as part of blood work today.  We will also get routine screening blood work including as listed below.  She is up-to-date with all other recommended screenings for a female of her age.  Will not administer any vaccines today per patient preference.  She will follow-up in approximately 6 months or sooner as needed.  She was encouraged to let me know if her lingering symptoms associated with her COVID-19 infection worsen between now and her next appointment.  She tells me she understands.  Tests ordered Orders Placed This Encounter  Procedures  . CBC with Differential/Platelets  . CMP with eGFR([facility])  . TSH  . Lipid Panel  . Hemoglobin A1c  . Vitamin D, 25-hydroxy  . Hep C Antibody      No orders of the defined types were placed in this encounter.   Patient to follow-up in 6 months or sooner as needed.  Ailene Ards, NP

## 2019-12-04 LAB — COMPLETE METABOLIC PANEL WITH GFR
AG Ratio: 2 (calc) (ref 1.0–2.5)
ALT: 19 U/L (ref 6–29)
AST: 23 U/L (ref 10–35)
Albumin: 4.5 g/dL (ref 3.6–5.1)
Alkaline phosphatase (APISO): 78 U/L (ref 37–153)
BUN: 12 mg/dL (ref 7–25)
CO2: 29 mmol/L (ref 20–32)
Calcium: 9.8 mg/dL (ref 8.6–10.4)
Chloride: 103 mmol/L (ref 98–110)
Creat: 0.88 mg/dL (ref 0.50–1.05)
GFR, Est African American: 86 mL/min/{1.73_m2} (ref >=60)
GFR, Est Non African American: 74 mL/min/{1.73_m2} (ref >=60)
Globulin: 2.3 g/dL (calc) (ref 1.9–3.7)
Glucose, Bld: 82 mg/dL (ref 65–99)
Potassium: 3.9 mmol/L (ref 3.5–5.3)
Sodium: 140 mmol/L (ref 135–146)
Total Bilirubin: 0.5 mg/dL (ref 0.2–1.2)
Total Protein: 6.8 g/dL (ref 6.1–8.1)

## 2019-12-04 LAB — CBC WITH DIFFERENTIAL/PLATELET
Absolute Monocytes: 459 cells/uL (ref 200–950)
Basophils Absolute: 32 cells/uL (ref 0–200)
Basophils Relative: 0.6 %
Eosinophils Absolute: 162 cells/uL (ref 15–500)
Eosinophils Relative: 3 %
HCT: 43.8 % (ref 35.0–45.0)
Hemoglobin: 14.8 g/dL (ref 11.7–15.5)
Lymphs Abs: 1804 cells/uL (ref 850–3900)
MCH: 31.3 pg (ref 27.0–33.0)
MCHC: 33.8 g/dL (ref 32.0–36.0)
MCV: 92.6 fL (ref 80.0–100.0)
MPV: 12.6 fL — ABNORMAL HIGH (ref 7.5–12.5)
Monocytes Relative: 8.5 %
Neutro Abs: 2943 cells/uL (ref 1500–7800)
Neutrophils Relative %: 54.5 %
Platelets: 222 10*3/uL (ref 140–400)
RBC: 4.73 10*6/uL (ref 3.80–5.10)
RDW: 11.6 % (ref 11.0–15.0)
Total Lymphocyte: 33.4 %
WBC: 5.4 10*3/uL (ref 3.8–10.8)

## 2019-12-04 LAB — LIPID PANEL
Cholesterol: 177 mg/dL (ref <200)
HDL: 65 mg/dL (ref >=50)
LDL Cholesterol (Calc): 91 mg/dL (calc)
Non-HDL Cholesterol (Calc): 112 mg/dL (calc) (ref <130)
Total CHOL/HDL Ratio: 2.7 (calc) (ref <5.0)
Triglycerides: 110 mg/dL (ref <150)

## 2019-12-04 LAB — HEMOGLOBIN A1C
Hgb A1c MFr Bld: 5.2 % of total Hgb (ref <5.7)
Mean Plasma Glucose: 103 (calc)
eAG (mmol/L): 5.7 (calc)

## 2019-12-04 LAB — TSH: TSH: 0.81 mIU/L

## 2019-12-04 LAB — VITAMIN D 25 HYDROXY (VIT D DEFICIENCY, FRACTURES): Vit D, 25-Hydroxy: 46 ng/mL (ref 30–100)

## 2019-12-04 LAB — HEPATITIS C ANTIBODY
Hepatitis C Ab: NONREACTIVE
SIGNAL TO CUT-OFF: 0.01 (ref <1.00)

## 2019-12-26 ENCOUNTER — Other Ambulatory Visit (HOSPITAL_COMMUNITY): Payer: Self-pay | Admitting: Ophthalmology

## 2019-12-26 MED FILL — DOXYCYCLINE HYC 50 MG CAP: 50 | 30 days supply | Qty: 30 | Fill #0

## 2019-12-26 MED FILL — RESTASIS 0.05% EYE EMULSION: 0.05 | 90 days supply | Qty: 180 | Fill #0

## 2019-12-26 MED FILL — PREDNISOLONE AC 1% EYE DROP: 1 | 25 days supply | Qty: 10 | Fill #0

## 2020-01-22 MED FILL — DOXYCYCLINE HYC 50 MG CAP: 50 | 30 days supply | Qty: 30 | Fill #1

## 2020-02-05 ENCOUNTER — Other Ambulatory Visit (HOSPITAL_COMMUNITY): Payer: Self-pay | Admitting: Ophthalmology

## 2020-05-05 ENCOUNTER — Encounter: Payer: Self-pay | Admitting: Adult Health

## 2020-05-05 ENCOUNTER — Other Ambulatory Visit (HOSPITAL_COMMUNITY)
Admission: RE | Admit: 2020-05-05 | Discharge: 2020-05-05 | Disposition: A | Discharge status: Home / Self Care | Payer: No Typology Code available for payment source | Source: Ambulatory Visit | Attending: Adult Health | Admitting: Adult Health

## 2020-05-05 ENCOUNTER — Other Ambulatory Visit (HOSPITAL_COMMUNITY): Payer: Self-pay

## 2020-05-05 ENCOUNTER — Ambulatory Visit (INDEPENDENT_AMBULATORY_CARE_PROVIDER_SITE_OTHER): Payer: No Typology Code available for payment source | Admitting: Adult Health

## 2020-05-05 ENCOUNTER — Other Ambulatory Visit: Payer: Self-pay

## 2020-05-05 VITALS — BP 106/70 | HR 65 | Ht 64.0 in | Wt 152.0 lb

## 2020-05-05 DIAGNOSIS — Z1211 Encounter for screening for malignant neoplasm of colon: Secondary | ICD-10-CM

## 2020-05-05 DIAGNOSIS — Z01419 Encounter for gynecological examination (general) (routine) without abnormal findings: Secondary | ICD-10-CM | POA: Diagnosis present

## 2020-05-05 LAB — HEMOCCULT GUIAC POC 1CARD (OFFICE): Fecal Occult Blood, POC: NEGATIVE

## 2020-05-05 NOTE — Progress Notes (Signed)
Patient ID: Shelby [name], female   DOB: [birth year], 55 y.o.   MRN: [id number] History of Present Illness:  Shelby [name] is a 55 year old white female, married, PM in for a well woman gyn exam and pap. 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 She had COVID and declines vaccines. PCP is Dr Karilyn Cota.  Current Medications, Allergies, Past Medical History, Past Surgical History, Family History and Social History were reviewed in [employer] record.     Review of Systems: Patient denies any headaches, hearing loss, fatigue, blurred vision, shortness of breath, chest pain, abdominal pain, problems with bowel movements, urination, or intercourse. No joint pain or mood swings.    Physical Exam:BP 106/70 (BP Location: Left Arm, Patient Position: Sitting, Cuff Size: Normal)   Pulse 65   Ht 5\' 4"  (1.626 m)   Wt 152 lb (68.9 kg)   BMI 26.09 kg/m  General:  Well developed, well nourished, no acute distress Skin:  Warm and dry Neck:  Midline trachea, normal thyroid, good ROM, no lymphadenopathy Lungs; Clear to auscultation bilaterally Breast:  No dominant palpable mass, retraction, or nipple discharge Cardiovascular: Regular rate and rhythm Abdomen:  Soft, non tender, no hepatosplenomegaly Pelvic:  External genitalia is normal in appearance, no lesions.  The vagina is pale with loss of moisture and rugae. Urethra has no lesions or masses. The cervix is smooth and atrophic, pap with HR HPV genotyping performed.  Uterus is felt to be normal size, shape, and contour.  No adnexal masses or tenderness noted.Bladder is non tender, no masses felt. Rectal: Good sphincter tone, no polyps, or hemorrhoids felt.(she had hemorrhoid banding)  Hemoccult negative. Extremities/musculoskeletal:  No swelling or varicosities noted, no clubbing or cyanosis Psych:  No mood changes, alert and cooperative,seems happy AA is 3 Fall risk is low PHQ 9 score is 1 GAD 7 score is 0  Upstream - 05/05/20 0841       Pregnancy Intention Screening   Does the patient want to become pregnant in the next year? N/A    Does the patient's partner want to become pregnant in the next year? N/A    Would the patient like to discuss contraceptive options today? N/A      Contraception Wrap Up   Current Method No Method - Other Reason   postmenopausal   End Method No Method - Other Reason   postmenopausal   Contraception Counseling Provided No         Examination chaperoned by 07/05/20 LPN  Impression and Plan: 1. Encounter for gynecological examination with Papanicolaou smear of cervix Pap sent Physicals with PCP Labs with PCP Pap in 3 years if normal Colonoscopy per GI Mammogram every 2 years she says   2. Encounter for screening fecal occult blood testing

## 2020-05-11 DIAGNOSIS — R87810 Cervical high risk human papillomavirus (HPV) DNA test positive: Secondary | ICD-10-CM

## 2020-05-11 HISTORY — DX: Cervical high risk human papillomavirus (HPV) DNA test positive: R87.810

## 2020-05-11 LAB — CYTOLOGY - PAP
Comment: NEGATIVE
Comment: NEGATIVE
HPV 16: NEGATIVE
HPV 18 / 45: POSITIVE — AB
High risk HPV: POSITIVE — AB

## 2020-05-11 NOTE — Telephone Encounter (Signed)
Pt aware that pap showed +HPV 18 and other and atrophic, ?atypia, will get colpo

## 2020-05-22 ENCOUNTER — Ambulatory Visit (INDEPENDENT_AMBULATORY_CARE_PROVIDER_SITE_OTHER): Payer: No Typology Code available for payment source | Admitting: Obstetrics & Gynecology

## 2020-05-22 ENCOUNTER — Encounter: Payer: Self-pay | Admitting: Obstetrics & Gynecology

## 2020-05-22 ENCOUNTER — Other Ambulatory Visit: Payer: Self-pay

## 2020-05-22 VITALS — BP 94/58 | HR 77 | Ht 64.0 in | Wt 149.0 lb

## 2020-05-22 DIAGNOSIS — R87810 Cervical high risk human papillomavirus (HPV) DNA test positive: Secondary | ICD-10-CM

## 2020-05-22 NOTE — Progress Notes (Signed)
    Colposcopy Procedure Note:    Colposcopy Procedure Note  Indications:  2022 cytology normal/+18/+HR HPV/negative 16   2019 ASCCP recommendation:  Smoker:  No. New sexual partner:  No.  : time frame:    History of abnormal Pap: yes  Procedure Details  The risks and benefits of the procedure and Written informed consent obtained.  Speculum placed in vagina and excellent visualization of cervix achieved, cervix swabbed x 3 with acetic acid solution.  Findings: Adequate colposcopy is noted today.  Cervix: no visible lesions, no mosaicism, no punctation and no abnormal vasculature; SCJ visualized 360 degrees without lesions and no biopsies taken. Vaginal inspection: normal without visible lesions. Vulvar colposcopy: normal mucosa without lesions.  Specimens: none  Complications: none.  Colposcopic Impression:   Plan(Based on 2019 ASCCP recommendations) Follow up 1 year

## 2020-06-03 ENCOUNTER — Ambulatory Visit (INDEPENDENT_AMBULATORY_CARE_PROVIDER_SITE_OTHER): Payer: No Typology Code available for payment source | Admitting: Nurse Practitioner

## 2021-01-07 ENCOUNTER — Ambulatory Visit
Admission: RE | Admit: 2021-01-07 | Discharge: 2021-01-07 | Disposition: A | Discharge status: Home / Self Care | Payer: No Typology Code available for payment source | Source: Ambulatory Visit | Attending: Family Medicine | Admitting: Family Medicine

## 2021-01-07 ENCOUNTER — Other Ambulatory Visit: Payer: Self-pay

## 2021-01-07 VITALS — BP 106/57 | HR 83 | Temp 98.3°F | Resp 18

## 2021-01-07 DIAGNOSIS — R350 Frequency of micturition: Secondary | ICD-10-CM | POA: Diagnosis not present

## 2021-01-07 DIAGNOSIS — R319 Hematuria, unspecified: Secondary | ICD-10-CM | POA: Diagnosis present

## 2021-01-07 DIAGNOSIS — N3001 Acute cystitis with hematuria: Secondary | ICD-10-CM | POA: Diagnosis not present

## 2021-01-07 LAB — POCT URINALYSIS DIP (MANUAL ENTRY)
Bilirubin, UA: NEGATIVE
Glucose, UA: NEGATIVE mg/dL
Ketones, POC UA: NEGATIVE mg/dL
Nitrite, UA: NEGATIVE
Protein Ur, POC: NEGATIVE mg/dL
Spec Grav, UA: 1.025 (ref 1.010–1.025)
Urobilinogen, UA: 0.2 E.U./dL
pH, UA: 5.5 (ref 5.0–8.0)

## 2021-01-07 MED ORDER — NITROFURANTOIN MONOHYD MACRO 100 MG PO CAPS
100.0000 mg | ORAL_CAPSULE | Freq: Two times a day (BID) | ORAL | 0 refills | Status: AC
  Filled 2021-01-07: qty 10, 5d supply, fill #0

## 2021-01-07 NOTE — ED Triage Notes (Signed)
Pt presents with hematuria, urinary frequency, dysuria started yesterday.

## 2021-01-07 NOTE — Discharge Instructions (Addendum)
Urine culture is pending. Treating with macrobid twice daily for 5 days while awaiting urine culture results.

## 2021-01-07 NOTE — ED Provider Notes (Signed)
[facility]    CSN: [id number] Arrival date & time: 01/07/21  1154      History   Chief Complaint Chief Complaint  Patient presents with   Hematuria   Urinary Frequency   Dysuria    HPI Shelby [name] is a 55 y.o. female.   HPI Patient here for evaluation of symptoms concerning for possible urinary tract infection.  Patient endorses urinary frequency, urgency dysuria and last night noticed blood in urine.  She denies any flank pain, abdominal pain, fever, nausea or vomiting. Reviewed EMR records dated 2016 patient had a UTI at that time and urine culture grew E. coli which was sensitive to multiple antibiotics.  Patient denies any history of recurrent UTIs.  Past Medical History:  Diagnosis Date   Hematuria 09/16/2014   Papanicolaou smear of cervix with positive high risk human papilloma virus (HPV) test 05/11/2020   04/2020 will get colpo   URI (upper respiratory infection)    Urinary frequency 09/16/2014   UTI (lower urinary tract infection) 09/16/2014   Vaginal Pap smear, abnormal     Patient Active Problem List   Diagnosis Date Noted   Papanicolaou smear of cervix with positive high risk human papilloma virus (HPV) test 05/11/2020   Encounter for screening fecal occult blood testing 05/05/2020   Encounter for general adult medical examination with abnormal findings 11/22/2018   Encounter for screening, unspecified 11/22/2018   Screening for deficiency anemia 11/22/2018   Vitamin D deficiency, unspecified 11/22/2018   Fatigue 11/22/2018   Screening for diabetes mellitus 11/22/2018   Lipid screening 11/22/2018   Thyroid disorder screen 11/22/2018   Grade II hemorrhoids 10/05/2018   Special screening for malignant neoplasms, colon 06/01/2017   Family history of colon cancer 06/01/2017   Screening for colorectal cancer 05/04/2017   Encounter for gynecological examination with Papanicolaou smear of cervix 05/04/2017   UTI (lower urinary tract infection)  09/16/2014   Hematuria 09/16/2014   Urinary frequency 09/16/2014    Past Surgical History:  Procedure Laterality Date   [place]     COLONOSCOPY N/A 10/25/2017   multiple small and large-mouthed diverticula in sigmoid colon, external hemorrhoids on retroflexion that were small.    PTOSIS REPAIR Bilateral 08/03/2017   Procedure: INTERNAL PTOSIS REPAIR BILATERAL;  Surgeon: Clista Bernhardt, MD;  Location: [facility];  Service: Ophthalmology;  Laterality: Bilateral;   THORACOTOMY  2005   TONSILLECTOMY     and adenoidectomy   vuvular repair      OB History     Gravida  1   Para      Term      Preterm      AB  1   Living  0      SAB  1   IAB      Ectopic      Multiple      Live Births  0            Home Medications    Prior to Admission medications   Medication Sig Start Date End Date Taking? Authorizing Provider  nitrofurantoin, macrocrystal-monohydrate, (MACROBID) 100 MG capsule Take 1 capsule (100 mg total) by mouth 2 (two) times daily for 5 days. 01/07/21 01/12/21 Yes Scot Jun, FNP  calcium elemental as carbonate (BARIATRIC TUMS ULTRA) 400 MG chewable tablet Chew 1,000 mg by mouth daily as needed for heartburn.    [provider]  Cholecalciferol (VITAMIN D3) 5000 units  CAPS Take 5,000 Units by mouth daily.     [provider]  cycloSPORINE (RESTASIS) 0.05 % ophthalmic emulsion INSTILL 1 DROP INTO BOTH EYES TWICE A DAY INSTILL 02/05/20 02/04/21  Elpidio Galea, MD  EPINEPHrine (EPIPEN 2-PAK) 0.3 mg/0.3 mL IJ SOAJ injection Inject 0.3 mLs (0.3 mg total) into the muscle as needed for anaphylaxis. 05/27/19   Elenore Paddy, NP  ibuprofen (ADVIL) 200 MG tablet Take 400 mg by mouth daily as needed for headache or moderate pain.     [provider]  loratadine (CLARITIN) 10 MG tablet Take 10 mg by mouth daily as needed for allergies.    [provider]  Multiple Vitamin  (MULTIVITAMIN) tablet Take 1 tablet by mouth daily.     [provider]  Propylene Glycol 0.6 % SOLN Place 1 drop into both eyes 2 (two) times daily.    [provider]  triamcinolone (KENALOG) 0.1 % paste Use as directed 1 application in the mouth or throat daily as needed (mouth ulcers).  07/17/17   [provider]    Family History Family History  Problem Relation Age of Onset   COPD Mother    Osteoporosis Mother    Hypertension Mother    Hypertension Father    Hypertension Sister    Anxiety disorder Sister    Stroke Maternal Grandmother    Hypertension Maternal Grandmother    Diabetes Maternal Grandmother    Cancer Maternal Grandfather    Heart attack Paternal Grandmother    Kidney failure Paternal Grandfather     Social History Social History   Tobacco Use   Smoking status: Former    Packs/day: 1.00    Years: 8.00    Pack years: 8.00    Types: Cigarettes   Smokeless tobacco: Never   Tobacco comments:    quit in 1996  Vaping Use   Vaping Use: Never used  Substance Use Topics   Alcohol use: Yes    Comment: 3 ounces every 3 weeks   Drug use: No     Allergies   Neosporin [neomycin-bacitracin zn-polymyx]   Review of Systems Review of Systems Pertinent negatives listed in HPI   Physical Exam Triage Vital Signs ED Triage Vitals [01/07/21 1207]  Enc Vitals Group     BP (!) 106/57     Pulse Rate 83     Resp 18     Temp 98.3 F (36.8 C)     Temp Source Oral     SpO2 95 %     Weight      Height      Head Circumference      Peak Flow      Pain Score      Pain Loc      Pain Edu?      Excl. in GC?    No data found.  Updated Vital Signs BP (!) 106/57 (BP Location: Left Arm)    Pulse 83    Temp 98.3 F (36.8 C) (Oral)    Resp 18    SpO2 95%   Visual Acuity Right Eye Distance:   Left Eye Distance:   Bilateral Distance:    Right Eye Near:   Left Eye Near:    Bilateral Near:     Physical Exam General appearance:  Alert, well developed, well nourished, cooperative  Head: Normocephalic, without obvious abnormality, atraumatic Respiratory: Respirations even and unlabored, normal respiratory rate Heart: Rate and rhythm normal.  Abdomen: No distention, no CVA  tenderness bilaterally  Skin: Skin color, texture, turgor normal. No rashes seen  Psych: Appropriate mood and affect. Neurologic: No obvious focal neurological abnormalities.   UC Treatments / Results  Labs (all labs ordered are listed, but only abnormal results are displayed) Labs Reviewed  POCT URINALYSIS DIP (MANUAL ENTRY) - Abnormal; Notable for the following components:      Result Value   Blood, UA moderate (*)    Leukocytes, UA Small (1+) (*)    All other components within normal limits  URINE CULTURE    EKG   Radiology No results found.  Procedures Procedures (including critical care time)  Medications Ordered in UC Medications - No data to display  Initial Impression / Assessment and Plan / UC Course  I have reviewed the triage vital signs and the nursing notes.  Pertinent labs & imaging results that were available during my care of the patient were reviewed by me and considered in my medical decision making (see chart for details).      Treating for acute cystitis with hematuria. UA abnormal and symptoms consistent with UTI. Empiric antibiotic treatment initiated. Encouraged increase intake of water.  Urine culture pending. ER if symptoms become severe. Follow-up with PCP if symptoms do not completely resolve.  Final Clinical Impressions(s) / UC Diagnoses   Final diagnoses:  Hematuria, unspecified type  Urinary frequency  Acute cystitis with hematuria     Discharge Instructions      Urine culture is pending. Treating with macrobid twice daily for 5 days while awaiting urine culture results.   ED Prescriptions     Medication Sig Dispense Auth. Provider   nitrofurantoin, macrocrystal-monohydrate,  (MACROBID) 100 MG capsule Take 1 capsule (100 mg total) by mouth 2 (two) times daily for 5 days. 10 capsule Scot Jun, FNP      PDMP not reviewed this encounter.   Scot Jun, FNP 01/07/21 [phone]

## 2021-01-09 LAB — URINE CULTURE
Culture: 50000 — AB
Special Requests: NORMAL

## 2021-03-16 ENCOUNTER — Ambulatory Visit: Payer: No Typology Code available for payment source | Admitting: Nurse Practitioner

## 2021-03-16 ENCOUNTER — Ambulatory Visit: Payer: No Typology Code available for payment source | Admitting: Internal Medicine

## 2021-03-18 ENCOUNTER — Other Ambulatory Visit (HOSPITAL_COMMUNITY): Payer: Self-pay | Admitting: Obstetrics & Gynecology

## 2021-03-18 DIAGNOSIS — Z1231 Encounter for screening mammogram for malignant neoplasm of breast: Secondary | ICD-10-CM

## 2021-03-19 ENCOUNTER — Encounter: Payer: Self-pay | Admitting: Internal Medicine

## 2021-03-19 ENCOUNTER — Ambulatory Visit (INDEPENDENT_AMBULATORY_CARE_PROVIDER_SITE_OTHER): Payer: No Typology Code available for payment source | Admitting: Internal Medicine

## 2021-03-19 ENCOUNTER — Other Ambulatory Visit: Payer: Self-pay

## 2021-03-19 VITALS — BP 104/74 | HR 78 | Resp 16 | Ht 63.75 in | Wt 154.6 lb

## 2021-03-19 DIAGNOSIS — Z131 Encounter for screening for diabetes mellitus: Secondary | ICD-10-CM | POA: Diagnosis not present

## 2021-03-19 DIAGNOSIS — Z13220 Encounter for screening for lipoid disorders: Secondary | ICD-10-CM

## 2021-03-19 DIAGNOSIS — Q100 Congenital ptosis: Secondary | ICD-10-CM

## 2021-03-19 DIAGNOSIS — Z0001 Encounter for general adult medical examination with abnormal findings: Secondary | ICD-10-CM

## 2021-03-19 DIAGNOSIS — Z1329 Encounter for screening for other suspected endocrine disorder: Secondary | ICD-10-CM

## 2021-03-19 DIAGNOSIS — E559 Vitamin D deficiency, unspecified: Secondary | ICD-10-CM

## 2021-03-19 DIAGNOSIS — Z2821 Immunization not carried out because of patient refusal: Secondary | ICD-10-CM

## 2021-03-19 DIAGNOSIS — K641 Second degree hemorrhoids: Secondary | ICD-10-CM

## 2021-03-19 NOTE — Progress Notes (Signed)
New Patient Office Visit  Subjective:  Patient ID: Shelby [name], female    DOB: [birth year]  Age: 56 y.o. MRN: [id number]  CC:  Chief Complaint  Patient presents with   Establish Care    HPI Shelby [name] is a 56 y.o. female with past medical history of congenital ptosis who presents for establishing care and annual physical.  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  She has been doing well overall.  She uses [facility] for her history of ptosis.  She follows up with Dr. Patrice Paradise for it.  She is up-to-date with colonoscopy and Pap smear.  She denies COVID, flu and Shingrix vaccines for now.    Past Medical History:  Diagnosis Date   Hematuria 09/16/2014   Papanicolaou smear of cervix with positive high risk human papilloma virus (HPV) test 05/11/2020   04/2020 will get colpo   URI (upper respiratory infection)    Urinary frequency 09/16/2014   UTI (lower urinary tract infection) 09/16/2014   Vaginal Pap smear, abnormal     Past Surgical History:  Procedure Laterality Date   Encampment     COLONOSCOPY N/A 10/25/2017   multiple small and large-mouthed diverticula in sigmoid colon, external hemorrhoids on retroflexion that were small.    PTOSIS REPAIR Bilateral 08/03/2017   Procedure: INTERNAL PTOSIS REPAIR BILATERAL;  Surgeon: Clista Bernhardt, MD;  Location: [facility];  Service: Ophthalmology;  Laterality: Bilateral;   THORACOTOMY  2005   TONSILLECTOMY     and adenoidectomy   vuvular repair      Family History  Problem Relation Age of Onset   COPD Mother    Osteoporosis Mother    Hypertension Mother    Hypertension Father    Hypertension Sister    Anxiety disorder Sister    Stroke Maternal Grandmother    Hypertension Maternal Grandmother    Diabetes Maternal Grandmother    Cancer Maternal Grandfather    Heart attack Paternal Grandmother    Kidney failure Paternal Grandfather     Social History   Socioeconomic History    Marital status: Married    Spouse name: Not on file   Number of children: Not on file   Years of education: Not on file   Highest education level: Not on file  Occupational History   Not on file  Tobacco Use   Smoking status: Former    Packs/day: 1.00    Years: 8.00    Pack years: 8.00    Types: Cigarettes   Smokeless tobacco: Never   Tobacco comments:    quit in 1996  Vaping Use   Vaping Use: Never used  Substance and Sexual Activity   Alcohol use: Yes    Comment: 3 ounces every 3 weeks   Drug use: No   Sexual activity: Yes    Birth control/protection: Post-menopausal  Other Topics Concern   Not on file  Social History Narrative   Not on file   Social Determinants of Health   Financial Resource Strain: Low Risk    Difficulty of Paying Living Expenses: Not hard at all  Food Insecurity: No Food Insecurity   Worried About Charity fundraiser in the Last Year: Never true   [place] in the Last Year: Never true  Transportation Needs: No Transportation Needs   Lack of Transportation (Medical): No   Lack of Transportation (Non-Medical): No  Physical Activity: Inactive  Days of Exercise per Week: 0 days   Minutes of Exercise per Session: 0 min  Stress: No Stress Concern Present   Feeling of Stress : Only a little  Social Connections: Engineer, building services of Communication with Friends and Family: More than three times a week   Frequency of Social Gatherings with Friends and Family: Twice a week   Attends Religious Services: More than 4 times per year   Active Member of [employer] or Organizations: Yes   Attends Music therapist: More than 4 times per year   Marital Status: Married  Human resources officer Violence: Not At Risk   Fear of Current or Ex-Partner: No   Emotionally Abused: No   Physically Abused: No   Sexually Abused: No    ROS Review of Systems  Constitutional:  Negative for chills and fever.  HENT:  Negative for congestion,  sinus pressure, sinus pain and sore throat.   Eyes:  Negative for pain and discharge.  Respiratory:  Negative for cough and shortness of breath.   Cardiovascular:  Negative for chest pain and palpitations.  Gastrointestinal:  Negative for abdominal pain, diarrhea, nausea and vomiting.  Endocrine: Negative for polydipsia and polyuria.  Genitourinary:  Negative for dysuria and hematuria.  Musculoskeletal:  Negative for neck pain and neck stiffness.  Skin:  Negative for rash.  Neurological:  Negative for dizziness and weakness.  Psychiatric/Behavioral:  Negative for agitation and behavioral problems.    Objective:   Today's Vitals: BP 104/74    Pulse 78    Resp 16    Ht 5' 3.75" (1.619 m)    Wt 154 lb 9.6 oz (70.1 kg)    SpO2 97%    BMI 26.75 kg/m   Physical Exam Vitals reviewed.  Constitutional:      General: She is not in acute distress.    Appearance: She is not diaphoretic.  HENT:     Head: Normocephalic and atraumatic.     Nose: Nose normal.     Mouth/Throat:     Mouth: Mucous membranes are moist.  Eyes:     General: No scleral icterus.    Extraocular Movements: Extraocular movements intact.     Comments: Right sided ptosis  Cardiovascular:     Rate and Rhythm: Normal rate and regular rhythm.     Pulses: Normal pulses.     Heart sounds: Normal heart sounds. No murmur heard. Pulmonary:     Breath sounds: Normal breath sounds. No wheezing or rales.  Abdominal:     Palpations: Abdomen is soft.     Tenderness: There is no abdominal tenderness.  Musculoskeletal:     Cervical back: Neck supple. No tenderness.     Right lower leg: No edema.     Left lower leg: No edema.  Skin:    General: Skin is warm.     Findings: No rash.  Neurological:     General: No focal deficit present.     Mental Status: She is alert and oriented to person, place, and time.     Sensory: No sensory deficit.     Motor: No weakness.  Psychiatric:        Mood and Affect: Mood normal.         Behavior: Behavior normal.    Assessment & Plan:   Problem List Items Addressed This Visit     Encounter for general adult medical examination with abnormal findings - Primary    Physical exam as documented. Denies  COVID, flu and Shingrix vaccine for now. Gets Pap smear with OB/GYN.      Relevant Orders   Hemoglobin A1c   CMP14+EGFR   CBC with Differential/Platelet   Congenital ptosis of both eyelids    Has had levator palpebrae surgery in the past Has persistent right eye ptosis Followed by Dr. Patrice Paradise      Other Visit Diagnoses     Grade II hemorrhoids   Has had band ligation in the past Denies any rectal bleeding currently         Screening for diabetes mellitus       Relevant Orders   Hemoglobin A1c   Lipid screening       Relevant Orders   Lipid panel   Thyroid disorder screen       Relevant Orders   TSH   Vitamin D deficiency       Relevant Orders   VITAMIN D 25 Hydroxy (Vit-D Deficiency, Fractures)       Outpatient Encounter Medications as of 03/19/2021  Medication Sig   calcium elemental as carbonate (BARIATRIC TUMS ULTRA) 400 MG chewable tablet Chew 1,000 mg by mouth daily as needed for heartburn.   Cholecalciferol (VITAMIN D3) 5000 units CAPS Take 5,000 Units by mouth daily.    cycloSPORINE (RESTASIS) 0.05 % ophthalmic emulsion 1 drop 2 (two) times daily.   EPINEPHrine (EPIPEN 2-PAK) 0.3 mg/0.3 mL IJ SOAJ injection Inject 0.3 mLs (0.3 mg total) into the muscle as needed for anaphylaxis.   ibuprofen (ADVIL) 200 MG tablet Take 400 mg by mouth daily as needed for headache or moderate pain.    loratadine (CLARITIN) 10 MG tablet Take 10 mg by mouth daily as needed for allergies.   Propylene Glycol 0.6 % SOLN Place 1 drop into both eyes 2 (two) times daily.   triamcinolone (KENALOG) 0.1 % paste Use as directed 1 application in the mouth or throat daily as needed (mouth ulcers).    Multiple Vitamin (MULTIVITAMIN) tablet Take 1 tablet by mouth daily.   (Patient not taking: Reported on 03/19/2021)   No facility-administered encounter medications on file as of 03/19/2021.    Follow-up: Return in about 1 year (around 03/19/2022) for Annual physical.   Lindell Spar, MD

## 2021-03-19 NOTE — Assessment & Plan Note (Signed)
Physical exam as documented. Denies COVID, flu and Shingrix vaccine for now. Gets Pap smear with OB/GYN. 

## 2021-03-19 NOTE — Assessment & Plan Note (Signed)
Has had levator palpebrae surgery in the past Has persistent right eye ptosis Followed by Dr. Patrice Paradise

## 2021-03-19 NOTE — Assessment & Plan Note (Signed)
Has had band ligation in the past Denies any rectal bleeding currently

## 2021-03-19 NOTE — Patient Instructions (Signed)
Please continue to take supplements.

## 2021-03-22 ENCOUNTER — Other Ambulatory Visit (HOSPITAL_COMMUNITY): Payer: Self-pay

## 2021-03-22 MED ORDER — CYCLOSPORINE 0.05 % OP EMUL
1.0000 [drp] | Freq: Two times a day (BID) | OPHTHALMIC | 3 refills | Status: DC
  Filled 2021-03-22: qty 180, 90d supply, fill #0

## 2021-03-24 ENCOUNTER — Other Ambulatory Visit: Payer: Self-pay

## 2021-03-24 ENCOUNTER — Ambulatory Visit (HOSPITAL_COMMUNITY)
Admission: RE | Admit: 2021-03-24 | Discharge: 2021-03-24 | Disposition: A | Discharge status: Home / Self Care | Payer: No Typology Code available for payment source | Source: Ambulatory Visit | Attending: Obstetrics & Gynecology | Admitting: Obstetrics & Gynecology

## 2021-03-24 ENCOUNTER — Encounter: Payer: Self-pay | Admitting: *Deleted

## 2021-03-24 DIAGNOSIS — Z1231 Encounter for screening mammogram for malignant neoplasm of breast: Secondary | ICD-10-CM | POA: Diagnosis not present

## 2021-03-24 DIAGNOSIS — Z2821 Immunization not carried out because of patient refusal: Secondary | ICD-10-CM | POA: Insufficient documentation

## 2021-03-25 ENCOUNTER — Encounter: Payer: Self-pay | Admitting: Internal Medicine

## 2021-03-25 ENCOUNTER — Other Ambulatory Visit (HOSPITAL_COMMUNITY)
Admission: RE | Admit: 2021-03-25 | Discharge: 2021-03-25 | Disposition: A | Discharge status: Home / Self Care | Payer: No Typology Code available for payment source | Source: Ambulatory Visit | Attending: Internal Medicine | Admitting: Internal Medicine

## 2021-03-25 DIAGNOSIS — Z0001 Encounter for general adult medical examination with abnormal findings: Secondary | ICD-10-CM | POA: Diagnosis present

## 2021-03-25 LAB — CBC WITH DIFFERENTIAL/PLATELET
Abs Immature Granulocytes: 0.01 10*3/uL (ref 0.00–0.07)
Basophils Absolute: 0 10*3/uL (ref 0.0–0.1)
Basophils Relative: 1 %
Eosinophils Absolute: 0.2 10*3/uL (ref 0.0–0.5)
Eosinophils Relative: 3 %
HCT: 44.4 % (ref 36.0–46.0)
Hemoglobin: 14.5 g/dL (ref 12.0–15.0)
Immature Granulocytes: 0 %
Lymphocytes Relative: 33 %
Lymphs Abs: 1.6 10*3/uL (ref 0.7–4.0)
MCH: 30.7 pg (ref 26.0–34.0)
MCHC: 32.7 g/dL (ref 30.0–36.0)
MCV: 94.1 fL (ref 80.0–100.0)
Monocytes Absolute: 0.4 10*3/uL (ref 0.1–1.0)
Monocytes Relative: 7 %
Neutro Abs: 2.8 10*3/uL (ref 1.7–7.7)
Neutrophils Relative %: 56 %
Platelets: 219 10*3/uL (ref 150–400)
RBC: 4.72 MIL/uL (ref 3.87–5.11)
RDW: 12.7 % (ref 11.5–15.5)
WBC: 5 10*3/uL (ref 4.0–10.5)
nRBC: 0 % (ref 0.0–0.2)

## 2021-03-25 LAB — COMPREHENSIVE METABOLIC PANEL
ALT: 17 U/L (ref 0–44)
AST: 20 U/L (ref 15–41)
Albumin: 4.3 g/dL (ref 3.5–5.0)
Alkaline Phosphatase: 68 U/L (ref 38–126)
Anion gap: 9 (ref 5–15)
BUN: 15 mg/dL (ref 6–20)
CO2: 25 mmol/L (ref 22–32)
Calcium: 9.2 mg/dL (ref 8.9–10.3)
Chloride: 101 mmol/L (ref 98–111)
Creatinine, Ser: 0.8 mg/dL (ref 0.44–1.00)
GFR, Estimated: >60 mL/min (ref >60)
Glucose, Bld: 92 mg/dL (ref 70–99)
Potassium: 4 mmol/L (ref 3.5–5.1)
Sodium: 135 mmol/L (ref 135–145)
Total Bilirubin: 0.3 mg/dL (ref 0.3–1.2)
Total Protein: 7.1 g/dL (ref 6.5–8.1)

## 2021-03-25 LAB — LIPID PANEL
Cholesterol: 171 mg/dL (ref 0–200)
HDL: 77 mg/dL (ref >40)
LDL Cholesterol: 83 mg/dL (ref 0–99)
Total CHOL/HDL Ratio: 2.2 RATIO
Triglycerides: 53 mg/dL (ref <150)
VLDL: 11 mg/dL (ref 0–40)

## 2021-03-25 LAB — TSH: TSH: 1.143 u[IU]/mL (ref 0.350–4.500)

## 2021-03-25 LAB — VITAMIN D 25 HYDROXY (VIT D DEFICIENCY, FRACTURES): Vit D, 25-Hydroxy: 26.88 ng/mL — ABNORMAL LOW (ref 30–100)

## 2021-03-25 LAB — HEMOGLOBIN A1C
Hgb A1c MFr Bld: 4.7 % — ABNORMAL LOW (ref 4.8–5.6)
Mean Plasma Glucose: 88.19 mg/dL

## 2021-03-26 NOTE — Telephone Encounter (Signed)
Mammo results in patients chart from Dr.Eure  ?

## 2021-04-19 ENCOUNTER — Other Ambulatory Visit: Payer: Self-pay

## 2021-04-19 ENCOUNTER — Ambulatory Visit (INDEPENDENT_AMBULATORY_CARE_PROVIDER_SITE_OTHER): Payer: No Typology Code available for payment source | Admitting: Internal Medicine

## 2021-04-19 ENCOUNTER — Encounter: Payer: Self-pay | Admitting: Internal Medicine

## 2021-04-19 DIAGNOSIS — J069 Acute upper respiratory infection, unspecified: Secondary | ICD-10-CM

## 2021-04-19 DIAGNOSIS — Z20822 Contact with and (suspected) exposure to covid-19: Secondary | ICD-10-CM

## 2021-04-19 LAB — POCT RAPID STREP A (OFFICE): Rapid Strep A Screen: NEGATIVE

## 2021-04-19 NOTE — Progress Notes (Signed)
?  ? ?Virtual Visit via Telephone Note  ? ?This visit type was conducted due to national recommendations for restrictions regarding the COVID-19 Pandemic (e.g. social distancing) in an effort to limit this patient's exposure and mitigate transmission in our community.  Due to her co-morbid illnesses, this patient is at least at moderate risk for complications without adequate follow up.  This format is felt to be most appropriate for this patient at this time.  The patient did not have access to video technology/had technical difficulties with video requiring transitioning to audio format only (telephone).  All issues noted in this document were discussed and addressed.  No physical exam could be performed with this format. ? ?Evaluation Performed:  Follow-up visit ? ?Date:  04/19/2021  ? ?ID:  Shelby [name], DOB 04/03/1965, MRN [id number] ? ?Patient Location: Home ?Provider Location: Office/Clinic ? ?Participants: Patient ?Location of Patient: Home ?Location of Provider: Telehealth ?Consent was obtain for visit to be over via telehealth. ?I verified that I am speaking with the correct person using two identifiers. ? ?PCP:  Anabel Halon, MD  ? ?Chief Complaint: Sore throat ? ?History of Present Illness:   ? ?Shelby [name] is a 56 y.o. female who has a televisit for complaint of sore throat and nasal congestion since yesterday.  She has tried taking Sudafed and Chloraseptic for sore throat.  She denies any fever, chills, dyspnea or wheezing currently.  Her rapid strep test was negative in the office today. ? ?The patient does have symptoms concerning for COVID-19 infection (fever, chills, cough, or new shortness of breath).  ? ?Past Medical, Surgical, Social History, Allergies, and Medications have been Reviewed. ? ?Past Medical History:  ?Diagnosis Date  ? Hematuria 09/16/2014  ? Papanicolaou smear of cervix with positive high risk human papilloma virus (HPV) test 05/11/2020  ? 04/2020 will get colpo  ? URI (upper  respiratory infection)   ? Urinary frequency 09/16/2014  ? UTI (lower urinary tract infection) 09/16/2014  ? Vaginal Pap smear, abnormal   ? ?Past Surgical History:  ?Procedure Laterality Date  ? BELPHAROPTOSIS REPAIR  1969  ? CERVIX LESION DESTRUCTION    ? COLONOSCOPY N/A 10/25/2017  ? multiple small and large-mouthed diverticula in sigmoid colon, external hemorrhoids on retroflexion that were small.   ? PTOSIS REPAIR Bilateral 08/03/2017  ? Procedure: INTERNAL PTOSIS REPAIR BILATERAL;  Surgeon: Floydene Flock, MD;  Location: [facility] OR;  Service: Ophthalmology;  Laterality: Bilateral;  ? THORACOTOMY  2005  ? TONSILLECTOMY    ? and adenoidectomy  ? vuvular repair    ?  ? ?Current Meds  ?Medication Sig  ? calcium elemental as carbonate (BARIATRIC TUMS ULTRA) 400 MG chewable tablet Chew 1,000 mg by mouth daily as needed for heartburn.  ? Cholecalciferol (VITAMIN D3) 5000 units CAPS Take 5,000 Units by mouth daily.   ? cycloSPORINE (RESTASIS) 0.05 % ophthalmic emulsion 1 drop 2 (two) times daily.  ? cycloSPORINE (RESTASIS) 0.05 % ophthalmic emulsion Instill 1 drop into both eyes twice a day  ? EPINEPHrine (EPIPEN 2-PAK) 0.3 mg/0.3 mL IJ SOAJ injection Inject 0.3 mLs (0.3 mg total) into the muscle as needed for anaphylaxis.  ? ibuprofen (ADVIL) 200 MG tablet Take 400 mg by mouth daily as needed for headache or moderate pain.   ? loratadine (CLARITIN) 10 MG tablet Take 10 mg by mouth daily as needed for allergies.  ? Multiple Vitamin (MULTIVITAMIN) tablet Take 1 tablet by mouth daily.  ? Propylene Glycol 0.6 %  SOLN Place 1 drop into both eyes 2 (two) times daily.  ? triamcinolone (KENALOG) 0.1 % paste Use as directed 1 application in the mouth or throat daily as needed (mouth ulcers).   ?  ? ?Allergies:   Neosporin [neomycin-bacitracin zn-polymyx]  ? ?ROS:   ?Please see the history of present illness.    ? ?All other systems reviewed and are negative. ? ? ?Labs/Other Tests and Data Reviewed:   ? ?Recent Labs: ?03/25/2021: ALT  17; BUN 15; Creatinine, Ser 0.80; Hemoglobin 14.5; Platelets 219; Potassium 4.0; Sodium 135; TSH 1.143  ? ?Recent Lipid Panel ?Lab Results  ?Component Value Date/Time  ? CHOL 171 03/25/2021 08:51 AM  ? TRIG 53 03/25/2021 08:51 AM  ? HDL 77 03/25/2021 08:51 AM  ? CHOLHDL 2.2 03/25/2021 08:51 AM  ? LDLCALC 83 03/25/2021 08:51 AM  ? LDLCALC 91 12/03/2019 09:22 AM  ? ? ?Wt Readings from Last 3 Encounters:  ?03/19/21 154 lb 9.6 oz (70.1 kg)  ?05/22/20 149 lb (67.6 kg)  ?05/05/20 152 lb (68.9 kg)  ?  ? ?ASSESSMENT & PLAN:   ? ?URTI ?Suspected COVID-19 infection ?Rapid strep test negative ?Check COVID RT-PCR ?Continue symptomatic treatment with Sudafed for nasal congestion and Mucinex for cough ?Chloraseptic throat spray as needed for sore throat ? ?Time:   ?Today, I have spent 7 minutes reviewing the chart, including problem list, medications, and with the patient with telehealth technology discussing the above problems. ? ? ?Medication Adjustments/Labs and Tests Ordered: ?Current medicines are reviewed at length with the patient today.  Concerns regarding medicines are outlined above.  ? ?Tests Ordered: ?Orders Placed This Encounter  ?Procedures  ? Novel Coronavirus, NAA ([facility])  ? POCT rapid strep A  ? ? ?Medication Changes: ?No orders of the defined types were placed in this encounter. ? ? ? ?Note: This dictation was prepared with Dragon dictation along with smaller phrase technology. Similar sounding words can be transcribed inadequately or may not be corrected upon review. Any transcriptional errors that result from this process are unintentional.  ?  ? ? ?Disposition:  Follow up  ?Signed, ?Anabel Halon, MD  ?04/19/2021 4:10 PM    ? ?[facility] ?

## 2021-04-21 LAB — NOVEL CORONAVIRUS, NAA: SARS-CoV-2, NAA: NOT DETECTED

## 2021-04-26 ENCOUNTER — Ambulatory Visit: Payer: No Typology Code available for payment source | Admitting: Internal Medicine

## 2021-05-17 ENCOUNTER — Ambulatory Visit: Payer: No Typology Code available for payment source | Admitting: Obstetrics & Gynecology

## 2021-05-17 ENCOUNTER — Other Ambulatory Visit (HOSPITAL_COMMUNITY)
Admission: RE | Admit: 2021-05-17 | Discharge: 2021-05-17 | Disposition: A | Discharge status: Home / Self Care | Payer: No Typology Code available for payment source | Source: Ambulatory Visit | Attending: Obstetrics & Gynecology | Admitting: Obstetrics & Gynecology

## 2021-05-17 ENCOUNTER — Encounter: Payer: Self-pay | Admitting: Obstetrics & Gynecology

## 2021-05-17 VITALS — BP 108/65 | HR 90 | Ht 63.4 in | Wt 156.4 lb

## 2021-05-17 DIAGNOSIS — Z01419 Encounter for gynecological examination (general) (routine) without abnormal findings: Secondary | ICD-10-CM | POA: Insufficient documentation

## 2021-05-17 DIAGNOSIS — R87810 Cervical high risk human papillomavirus (HPV) DNA test positive: Secondary | ICD-10-CM

## 2021-05-21 ENCOUNTER — Encounter: Payer: Self-pay | Admitting: Obstetrics & Gynecology

## 2021-05-21 LAB — CYTOLOGY - PAP
Comment: NEGATIVE
Comment: NEGATIVE
Comment: NEGATIVE
Diagnosis: HIGH — AB
HPV 16: NEGATIVE
HPV 18 / 45: POSITIVE — AB
High risk HPV: POSITIVE — AB

## 2021-05-26 ENCOUNTER — Telehealth: Payer: Self-pay | Admitting: *Deleted

## 2021-05-26 NOTE — Telephone Encounter (Signed)
Pt concerned about pap results. Please review and advise. Thanks! JSY ?

## 2021-05-27 NOTE — Telephone Encounter (Signed)
I sent her a mychart message to schedule a colposcopy appointment

## 2021-06-03 ENCOUNTER — Ambulatory Visit (INDEPENDENT_AMBULATORY_CARE_PROVIDER_SITE_OTHER): Payer: No Typology Code available for payment source | Admitting: Obstetrics & Gynecology

## 2021-06-03 ENCOUNTER — Other Ambulatory Visit (HOSPITAL_COMMUNITY)
Admission: RE | Admit: 2021-06-03 | Discharge: 2021-06-03 | Disposition: A | Discharge status: Home / Self Care | Payer: No Typology Code available for payment source | Source: Ambulatory Visit | Attending: Obstetrics & Gynecology | Admitting: Obstetrics & Gynecology

## 2021-06-03 ENCOUNTER — Encounter: Payer: Self-pay | Admitting: Obstetrics & Gynecology

## 2021-06-03 VITALS — BP 104/66 | HR 69 | Ht 63.75 in | Wt 154.0 lb

## 2021-06-03 DIAGNOSIS — N870 Mild cervical dysplasia: Secondary | ICD-10-CM | POA: Diagnosis not present

## 2021-06-03 DIAGNOSIS — R87810 Cervical high risk human papillomavirus (HPV) DNA test positive: Secondary | ICD-10-CM | POA: Diagnosis present

## 2021-06-03 NOTE — Progress Notes (Signed)
    Colposcopy Procedure Note:    Colposcopy Procedure Note  Indications:  ASCUS +18/45 HPV   2019 ASCCP recommendation:  Smoker:  No. New sexual partner:  No.  : time frame:    History of abnormal Pap: yes  Procedure Details  The risks and benefits of the procedure and Written informed consent obtained.  Speculum placed in vagina and excellent visualization of cervix achieved, cervix swabbed x 3 with acetic acid solution.  Findings: Adequate colposcopy is noted today.  Cervix: no visible lesions, no mosaicism, no punctation, and no abnormal vasculature; endocervical curettage performed. Vaginal inspection: normal without visible lesions. Vulvar colposcopy: vulvar colposcopy not performed.  Specimens: ECC  Complications: none.  Colposcopic Impression: Normal colposcopy--> ECC investigated  Plan(Based on 2019 ASCCP recommendations) Follow up based on ECC findings

## 2021-06-08 LAB — SURGICAL PATHOLOGY

## 2021-10-20 NOTE — Progress Notes (Signed)
Subjective:     Shelby [name] is a 56 y.o. female here for a routine exam.  No LMP recorded. Patient is postmenopausal. G1P0010 Birth Control Method:  menopausal Menstrual Calendar(currently):   Current complaints: .   Current acute medical issues:  HPV evaluation: 4/22 +HR HPV neg 16/18   Recent Gynecologic History No LMP recorded. Patient is postmenopausal. Last Pap: 22,  as above Last mammogram: 3/23,  normal  Past Medical History:  Diagnosis Date   Hematuria 09/16/2014   Papanicolaou smear of cervix with positive high risk human papilloma virus (HPV) test 05/11/2020   04/2020 will get colpo   URI (upper respiratory infection)    Urinary frequency 09/16/2014   UTI (lower urinary tract infection) 09/16/2014   Vaginal Pap smear, abnormal     Past Surgical History:  Procedure Laterality Date   Humble     COLONOSCOPY N/A 10/25/2017   multiple small and large-mouthed diverticula in sigmoid colon, external hemorrhoids on retroflexion that were small.    PTOSIS REPAIR Bilateral 08/03/2017   Procedure: INTERNAL PTOSIS REPAIR BILATERAL;  Surgeon: Clista Bernhardt, MD;  Location: [facility];  Service: Ophthalmology;  Laterality: Bilateral;   THORACOTOMY  2005   TONSILLECTOMY     and adenoidectomy   vuvular repair      OB History     Gravida  1   Para      Term      Preterm      AB  1   Living  0      SAB  1   IAB      Ectopic      Multiple      Live Births  0           Social History   Socioeconomic History   Marital status: Married    Spouse name: Not on file   Number of children: Not on file   Years of education: Not on file   Highest education level: Not on file  Occupational History   Not on file  Tobacco Use   Smoking status: Former    Packs/day: 1.00    Years: 8.00    Total pack years: 8.00    Types: Cigarettes   Smokeless tobacco: Never   Tobacco comments:    quit in [place] Use    Vaping Use: Never used  Substance and Sexual Activity   Alcohol use: Yes    Comment: 3 ounces every 3 weeks   Drug use: No   Sexual activity: Yes    Birth control/protection: Post-menopausal  Other Topics Concern   Not on file  Social History Narrative   Not on file   Social Determinants of Health   Financial Resource Strain: Low Risk  (05/05/2020)   Overall Financial Resource Strain (CARDIA)    Difficulty of Paying Living Expenses: Not hard at all  Food Insecurity: No Food Insecurity (05/05/2020)   Hunger Vital Sign    Worried About Running Out of Food in the Last Year: Never true    [place] in the Last Year: Never true  Transportation Needs: No Transportation Needs (05/05/2020)   PRAPARE - Hydrologist (Medical): No    Lack of Transportation (Non-Medical): No  Physical Activity: Inactive (05/05/2020)   Exercise Vital Sign    Days of Exercise per Week: 0 days    Minutes of Exercise per Session:  0 min  Stress: No Stress Concern Present (05/05/2020)   [employer] of Occupational Health - Occupational Stress Questionnaire    Feeling of Stress : Only a little  Social Connections: Socially Integrated (05/05/2020)   Social Connection and Isolation Panel [NHANES]    Frequency of Communication with Friends and Family: More than three times a week    Frequency of Social Gatherings with Friends and Family: Twice a week    Attends Religious Services: More than 4 times per year    Active Member of [employer] or Organizations: Yes    Attends Engineer, structural: More than 4 times per year    Marital Status: Married    Family History  Problem Relation Age of Onset   COPD Mother    Osteoporosis Mother    Hypertension Mother    Hypertension Father    Hypertension Sister    Anxiety disorder Sister    Stroke Maternal Grandmother    Hypertension Maternal Grandmother    Diabetes Maternal Grandmother    Cancer Maternal Grandfather    Heart  attack Paternal Grandmother    Kidney failure Paternal Grandfather      Current Outpatient Medications:    calcium elemental as carbonate (BARIATRIC TUMS ULTRA) 400 MG chewable tablet, Chew 1,000 mg by mouth daily as needed for heartburn., Disp: , Rfl:    Cholecalciferol (VITAMIN D3) 5000 units CAPS, Take 5,000 Units by mouth daily. , Disp: , Rfl:    cycloSPORINE (RESTASIS) 0.05 % ophthalmic emulsion, Instill 1 drop into both eyes twice a day, Disp: 180 each, Rfl: 3   ibuprofen (ADVIL) 200 MG tablet, Take 400 mg by mouth daily as needed for headache or moderate pain. , Disp: , Rfl:    loratadine (CLARITIN) 10 MG tablet, Take 10 mg by mouth daily as needed for allergies., Disp: , Rfl:    Multiple Vitamin (MULTIVITAMIN) tablet, Take 1 tablet by mouth daily., Disp: , Rfl:    triamcinolone (KENALOG) 0.1 % paste, Use as directed 1 application in the mouth or throat daily as needed (mouth ulcers). , Disp: , Rfl:    EPINEPHrine (EPIPEN 2-PAK) 0.3 mg/0.3 mL IJ SOAJ injection, Inject 0.3 mLs (0.3 mg total) into the muscle as needed for anaphylaxis., Disp: 1 each, Rfl: 1  Review of Systems  Review of Systems  Constitutional: Negative for fever, chills, weight loss, malaise/fatigue and diaphoresis.  HENT: Negative for hearing loss, ear pain, nosebleeds, congestion, sore throat, neck pain, tinnitus and ear discharge.   Eyes: Negative for blurred vision, double vision, photophobia, pain, discharge and redness.  Respiratory: Negative for cough, hemoptysis, sputum production, shortness of breath, wheezing and stridor.   Cardiovascular: Negative for chest pain, palpitations, orthopnea, claudication, leg swelling and PND.  Gastrointestinal: negative for abdominal pain. Negative for heartburn, nausea, vomiting, diarrhea, constipation, blood in stool and melena.  Genitourinary: Negative for dysuria, urgency, frequency, hematuria and flank pain.  Musculoskeletal: Negative for myalgias, back pain, joint pain and  falls.  Skin: Negative for itching and rash.  Neurological: Negative for dizziness, tingling, tremors, sensory change, speech change, focal weakness, seizures, loss of consciousness, weakness and headaches.  Endo/Heme/Allergies: Negative for environmental allergies and polydipsia. Does not bruise/bleed easily.  Psychiatric/Behavioral: Negative for depression, suicidal ideas, hallucinations, memory loss and substance abuse. The patient is not nervous/anxious and does not have insomnia.        Objective:  Blood pressure 108/65, pulse 90, height 5' 3.4" (1.61 m), weight 156 lb 6.4 oz (70.9 kg).  Physical Exam  Vitals reviewed. Constitutional: She is oriented to person, place, and time. She appears well-developed and well-nourished.        Vulva is normal without lesions Vagina is pink moist without discharge Cervix normal in appearance and pap is done        Medications Ordered at today's visit: No orders of the defined types were placed in this encounter.   Other orders placed at today's visit: No orders of the defined types were placed in this encounter.     Assessment:    Normal Gyn exam.    Plan:    Await HPV based cytology      No follow-ups on file.

## 2022-02-02 ENCOUNTER — Other Ambulatory Visit: Payer: Self-pay | Admitting: Internal Medicine

## 2022-02-02 ENCOUNTER — Encounter: Payer: Self-pay | Admitting: Internal Medicine

## 2022-02-02 DIAGNOSIS — T7840XA Allergy, unspecified, initial encounter: Secondary | ICD-10-CM

## 2022-02-02 MED ORDER — EPINEPHRINE 0.3 MG/0.3ML IJ SOAJ: 0.3000 mg | INTRAMUSCULAR | 3 refills | Status: DC | PRN

## 2022-02-03 ENCOUNTER — Encounter: Payer: Self-pay | Admitting: Internal Medicine

## 2022-02-03 ENCOUNTER — Ambulatory Visit (INDEPENDENT_AMBULATORY_CARE_PROVIDER_SITE_OTHER): Payer: 59 | Admitting: Internal Medicine

## 2022-02-03 VITALS — BP 113/78 | HR 72 | Ht 63.4 in | Wt 152.2 lb

## 2022-02-03 DIAGNOSIS — E559 Vitamin D deficiency, unspecified: Secondary | ICD-10-CM | POA: Diagnosis not present

## 2022-02-03 DIAGNOSIS — Z131 Encounter for screening for diabetes mellitus: Secondary | ICD-10-CM | POA: Diagnosis not present

## 2022-02-03 DIAGNOSIS — Z13220 Encounter for screening for lipoid disorders: Secondary | ICD-10-CM | POA: Diagnosis not present

## 2022-02-03 DIAGNOSIS — T7840XA Allergy, unspecified, initial encounter: Secondary | ICD-10-CM

## 2022-02-03 DIAGNOSIS — L508 Other urticaria: Secondary | ICD-10-CM | POA: Diagnosis not present

## 2022-02-03 DIAGNOSIS — T783XXA Angioneurotic edema, initial encounter: Secondary | ICD-10-CM | POA: Diagnosis not present

## 2022-02-03 NOTE — Patient Instructions (Signed)
Please take Claritin or Zyrtec as needed for allergic reaction.  Please use EpiPen if you develop lip swelling or shortness of breath.

## 2022-02-04 DIAGNOSIS — L508 Other urticaria: Secondary | ICD-10-CM | POA: Insufficient documentation

## 2022-02-04 DIAGNOSIS — T783XXA Angioneurotic edema, initial encounter: Secondary | ICD-10-CM | POA: Insufficient documentation

## 2022-02-04 NOTE — Progress Notes (Signed)
Acute Office Visit  Subjective:    Patient ID: Shelby [name], female    DOB: 1965-09-04, 57 y.o.   MRN: [id number]  Chief Complaint  Patient presents with   Urticaria    Patient said on Monday she started having hives and lip swelling    HPI Patient is in today for complaint of hives over her hands and lip swelling.  She was exposed to cleaning liquid while she was sitting in cafe on 01/08, where a glass near her table had recent application of cleaning liquid.  She started having itching with erythematous patches on the hands.  She also had upper lip swelling and later had oral mucosal swelling once as well, which has resolved now.  She has tried taking Benadryl with some relief.  She has had recurrent hives on her hands and trunk.  Of note, she is also allergic to pet dander.  She currently has a cat, but she does not any reaction while taking care of her.  Past Medical History:  Diagnosis Date   Hematuria 09/16/2014   Papanicolaou smear of cervix with positive high risk human papilloma virus (HPV) test 05/11/2020   04/2020 will get colpo   URI (upper respiratory infection)    Urinary frequency 09/16/2014   UTI (lower urinary tract infection) 09/16/2014   Vaginal Pap smear, abnormal     Past Surgical History:  Procedure Laterality Date   BELPHAROPTOSIS REPAIR  1969   CERVIX LESION DESTRUCTION     COLONOSCOPY N/A 10/25/2017   multiple small and large-mouthed diverticula in sigmoid colon, external hemorrhoids on retroflexion that were small.    PTOSIS REPAIR Bilateral 08/03/2017   Procedure: INTERNAL PTOSIS REPAIR BILATERAL;  Surgeon: Floydene Flock, MD;  Location: [facility];  Service: Ophthalmology;  Laterality: Bilateral;   THORACOTOMY  2005   TONSILLECTOMY     and adenoidectomy   vuvular repair      Family History  Problem Relation Age of Onset   COPD Mother    Osteoporosis Mother    Hypertension Mother    Hypertension Father    Hypertension Sister    Anxiety disorder  Sister    Stroke Maternal Grandmother    Hypertension Maternal Grandmother    Diabetes Maternal Grandmother    Cancer Maternal Grandfather    Heart attack Paternal Grandmother    Kidney failure Paternal Grandfather     Social History   Socioeconomic History   Marital status: Married    Spouse name: Not on file   Number of children: Not on file   Years of education: Not on file   Highest education level: Not on file  Occupational History   Not on file  Tobacco Use   Smoking status: Former    Packs/day: 1.00    Years: 8.00    Total pack years: 8.00    Types: Cigarettes   Smokeless tobacco: Never   Tobacco comments:    quit in 1996  Vaping Use   Vaping Use: Never used  Substance and Sexual Activity   Alcohol use: Yes    Comment: 3 ounces every 3 weeks   Drug use: No   Sexual activity: Yes    Birth control/protection: Post-menopausal  Other Topics Concern   Not on file  Social History Narrative   Not on file   Social Determinants of Health   Financial Resource Strain: Low Risk  (05/05/2020)   Overall Financial Resource Strain (CARDIA)    Difficulty of Paying Living Expenses:  Not hard at all  Food Insecurity: No Food Insecurity (05/05/2020)   Hunger Vital Sign    Worried About Running Out of Food in the Last Year: Never true    Ran Out of Food in the Last Year: Never true  Transportation Needs: No Transportation Needs (05/05/2020)   PRAPARE - Hydrologist (Medical): No    Lack of Transportation (Non-Medical): No  Physical Activity: Inactive (05/05/2020)   Exercise Vital Sign    Days of Exercise per Week: 0 days    Minutes of Exercise per Session: 0 min  Stress: No Stress Concern Present (05/05/2020)   [place]    Feeling of Stress : Only a little  Social Connections: Socially Integrated (05/05/2020)   Social Connection and Isolation Panel [NHANES]    Frequency of  Communication with Friends and Family: More than three times a week    Frequency of Social Gatherings with Friends and Family: Twice a week    Attends Religious Services: More than 4 times per year    Active Member of [employer] or Organizations: Yes    Attends Music therapist: More than 4 times per year    Marital Status: Married  Human resources officer Violence: Not At Risk (05/05/2020)   Humiliation, Afraid, Rape, and Kick questionnaire    Fear of Current or Ex-Partner: No    Emotionally Abused: No    Physically Abused: No    Sexually Abused: No    Outpatient Medications Prior to Visit  Medication Sig Dispense Refill   calcium elemental as carbonate (BARIATRIC TUMS ULTRA) 400 MG chewable tablet Chew 1,000 mg by mouth daily as needed for heartburn.     Cholecalciferol (VITAMIN D3) 5000 units CAPS Take 5,000 Units by mouth daily.      cycloSPORINE (RESTASIS) 0.05 % ophthalmic emulsion Instill 1 drop into both eyes twice a day 180 each 3   EPINEPHrine (EPIPEN 2-PAK) 0.3 mg/0.3 mL IJ SOAJ injection Inject 0.3 mg into the muscle as needed for anaphylaxis. 1 each 3   ibuprofen (ADVIL) 200 MG tablet Take 400 mg by mouth daily as needed for headache or moderate pain.      loratadine (CLARITIN) 10 MG tablet Take 10 mg by mouth daily as needed for allergies.     Multiple Vitamin (MULTIVITAMIN) tablet Take 1 tablet by mouth daily.     triamcinolone (KENALOG) 0.1 % paste Use as directed 1 application in the mouth or throat daily as needed (mouth ulcers).      No facility-administered medications prior to visit.    Allergies  Allergen Reactions   Neosporin [Neomycin-Bacitracin Zn-Polymyx] Itching and Swelling    Review of Systems  Constitutional:  Negative for chills and fever.  HENT:  Negative for congestion, sinus pressure, sinus pain and sore throat.   Eyes:  Negative for pain and discharge.  Respiratory:  Negative for cough and shortness of breath.   Cardiovascular:  Negative for  chest pain and palpitations.  Gastrointestinal:  Negative for abdominal pain, diarrhea, nausea and vomiting.  Endocrine: Negative for polydipsia and polyuria.  Genitourinary:  Negative for dysuria and hematuria.  Musculoskeletal:  Negative for neck pain and neck stiffness.  Skin:  Positive for rash.  Neurological:  Negative for dizziness and weakness.  Psychiatric/Behavioral:  Negative for agitation and behavioral problems.        Objective:    Physical Exam Vitals reviewed.  Constitutional:  General: She is not in acute distress.    Appearance: She is not diaphoretic.  HENT:     Head: Normocephalic and atraumatic.     Nose: Nose normal.     Mouth/Throat:     Mouth: Mucous membranes are moist.  Eyes:     General: No scleral icterus.    Extraocular Movements: Extraocular movements intact.     Comments: Right sided ptosis  Cardiovascular:     Rate and Rhythm: Normal rate and regular rhythm.     Pulses: Normal pulses.     Heart sounds: Normal heart sounds. No murmur heard. Pulmonary:     Breath sounds: Normal breath sounds. No wheezing or rales.  Musculoskeletal:     Cervical back: Neck supple. No tenderness.     Right lower leg: No edema.     Left lower leg: No edema.  Skin:    General: Skin is warm.     Findings: No rash.  Neurological:     General: No focal deficit present.     Mental Status: She is alert and oriented to person, place, and time.     Sensory: No sensory deficit.     Motor: No weakness.  Psychiatric:        Mood and Affect: Mood normal.        Behavior: Behavior normal.     BP 113/78 (BP Location: Left Arm, Patient Position: Sitting, Cuff Size: Normal)   Pulse 72   Ht 5' 3.4" (1.61 m)   Wt 152 lb 3.2 oz (69 kg)   SpO2 98%   BMI 26.62 kg/m  Wt Readings from Last 3 Encounters:  02/03/22 152 lb 3.2 oz (69 kg)  06/03/21 154 lb (69.9 kg)  05/17/21 156 lb 6.4 oz (70.9 kg)        Assessment & Plan:   Problem List Items Addressed This  Visit       Musculoskeletal and Integument   Chronic urticaria    Had hives in the last week, recurrent - likely from cleaning liquid exposure Tried Claritin/Benadryl as needed, continue for now Has allergy to pet dander, avoid pet exposure if possible        Other   Angio-edema - Primary    Had lip swelling and oral mucosal swelling after cleaning liquid exposure, likely angioedema EpiPen refilled Benadryl/Claritin as needed Referred to allergy and immunology clinic       Relevant Orders   CBC with Differential/Platelet   CMP14+EGFR   TSH   Other Visit Diagnoses     Vitamin D deficiency       Relevant Orders   Vitamin D (25 hydroxy)   Lipid screening       Relevant Orders   Lipid Profile   Screening for diabetes mellitus       Relevant Orders   Hemoglobin A1c        No orders of the defined types were placed in this encounter.    Lindell Spar, MD

## 2022-02-04 NOTE — Assessment & Plan Note (Signed)
Had lip swelling and oral mucosal swelling after cleaning liquid exposure, likely angioedema EpiPen refilled Benadryl/Claritin as needed Referred to allergy and immunology clinic

## 2022-02-04 NOTE — Assessment & Plan Note (Signed)
Had hives in the last week, recurrent - likely from cleaning liquid exposure Tried Claritin/Benadryl as needed, continue for now Has allergy to pet dander, avoid pet exposure if possible

## 2022-02-09 ENCOUNTER — Encounter: Payer: Self-pay | Admitting: Allergy & Immunology

## 2022-02-09 ENCOUNTER — Other Ambulatory Visit: Payer: Self-pay

## 2022-02-09 ENCOUNTER — Ambulatory Visit (INDEPENDENT_AMBULATORY_CARE_PROVIDER_SITE_OTHER): Payer: 59 | Admitting: Allergy & Immunology

## 2022-02-09 ENCOUNTER — Other Ambulatory Visit (HOSPITAL_COMMUNITY): Payer: Self-pay

## 2022-02-09 VITALS — BP 116/70 | HR 72 | Temp 97.8°F | Resp 20 | Ht 63.0 in | Wt 153.0 lb

## 2022-02-09 DIAGNOSIS — L508 Other urticaria: Secondary | ICD-10-CM | POA: Diagnosis not present

## 2022-02-09 DIAGNOSIS — J302 Other seasonal allergic rhinitis: Secondary | ICD-10-CM

## 2022-02-09 DIAGNOSIS — R220 Localized swelling, mass and lump, head: Secondary | ICD-10-CM | POA: Diagnosis not present

## 2022-02-09 MED ORDER — EPINEPHRINE 0.3 MG/0.3ML IJ SOAJ
0.3000 mg | INTRAMUSCULAR | 3 refills | Status: AC | PRN
  Filled 2022-02-09: qty 2, 2d supply, fill #0
  Filled 2022-12-01: qty 2, 2d supply, fill #1

## 2022-02-09 NOTE — Patient Instructions (Addendum)
1. Physical urticaria with lip swelling - I think we can defer on any workup given the relative infrequency of reactions. - We could certainly do some allergy testing, but her allergic rhinitis symptoms and food symptoms were so minimal, that this would be a waste of time and money. - We could have done some blood work to rule out weird causes of swelling   2. Return in about 6 months (around 08/10/2022) or earlier as needed.     Please inform us of any Emergency Department visits, hospitalizations, or changes in symptoms. Call us before going to the ED for breathing or allergy symptoms since we might be able to fit you in for a sick visit. Feel free to contact us anytime with any questions, problems, or concerns.  It was a pleasure to meet you today!  Websites that have reliable patient information: 1. American Academy of Asthma, Allergy, and Immunology: www.aaaai.org 2. Food Allergy Research and Education (FARE): foodallergy.org 3. Mothers of Asthmatics: http://www.asthmacommunitynetwork.org 4. American College of Allergy, Asthma, and Immunology: www.acaai.org   COVID-19 Vaccine Information can be found at: ShippingScam.co.uk For questions related to vaccine distribution or appointments, please email [email or web address] or call [phone].   We realize that you might be concerned about having an allergic reaction to the COVID19 vaccines. To help with that concern, WE ARE OFFERING THE COVID19 VACCINES IN OUR OFFICE! Ask the front desk for dates!     "Like" [place] on Facebook and Instagram for our latest updates!      A healthy democracy works best when [employer] participate! Make sure you are registered to vote! If you have moved or changed any of your contact information, you will need to get this updated before voting!  In some cases, you MAY be able to register to vote online:  [email or web address]

## 2022-02-09 NOTE — Progress Notes (Signed)
Peripheral  NEW PATIENT  Date of Service/Encounter:  02/09/22  Consult requested by: Anabel Halon, MD   Assessment:   Physical urticaria  Lip swelling  Seasonal allergic rhinitis - did not do testing  Preference for no medications  Plan/Recommendations:   1. Physical urticaria with lip swelling - I think we can defer on any workup given the relative infrequency of reactions. - We could certainly do some allergy testing, but her allergic rhinitis symptoms and food symptoms were so minimal, that this would be a waste of time and money. - We could have done some blood work to rule out weird causes of swelling   2. Return in about 6 months (around 08/10/2022) or earlier as needed.     This note in its entirety was forwarded to the Provider who requested this consultation.  Subjective:   Shelby [name] is a 58 y.o. female presenting today for evaluation of  Chief Complaint  Patient presents with   Allergy Testing    LAST WEEK LEFT SIDE OF LIP SWELLED UP AND HER CHEEK ON LEFT SIDE. PT STATES S HE GETS HIVES AND THINKS IT IS HIVES.    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  Shelby [name] was referred by Anabel Halon, MD.     Shelby [name] is a 57 y.o. female presenting for an evaluation of urticaria . This has been going on for a while, but   When she was very young, she had allergies and testing. She  does not remember having any allergy shots.   Then one day 15 years ago, there was some cold rebar that she moved it out of the way and developed urticaria for several hours.   A few years ago, she had swelling of her upper lips from salmon, potatoes, and asparagus. She has reintroduced those without a problem, one at a time. She got an EpiPen just to be on the safe side.  Occasionally, she was notice some hives where her panties press against her body. This happens relatively frequently, but still only 2-3 times per year at the most. She is not inclined to do much about it.   Monday one week ago, she was at work and everything was normal. She noticed some swelling after eating chicken and potatoes. She also had a cup of coffee. It was located only on her upper lip. She did not take anything at the time. She had hive son her panty line as well at the time. She had a hive on her left shoulder. She took a Benadryl at the time and she slept through the night. She had some minor swelling of her hands that she noticed the next morning or before she went to bed.   The following day, she took a Benadryl three times during the day. She came home and was improving. She hugged her husband and started swelling on the left side of her  face after hugging her husband. Again, no systemic symptoms.   Allergic Rhinitis Symptom History: She has very mild environmental allergy symptoms. She has pets and she is not willing to get rid of them. She does have some rhinorrhea and sneezing when she gets a new pet. She takes no medicines routinely for this. She take Claritin and Sudafed.  She has one cat and one dog. The cat does sleep at the feet sometimes. Dog is in the bedroom.   Skin Symptom History: She has noticed that she has reactivity to a certain eye cream. She has just learned to avoid certain triggers.   Otherwise, there is no history of other atopic diseases, including asthma, food allergies, drug allergies,  stinging insect allergies, or contact dermatitis. There is no significant infectious history. Vaccinations are up to date.    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 If you have any questions, ask your nurse or doctor.          calcium elemental as carbonate 400 MG chewable tablet Commonly known as: BARIATRIC TUMS ULTRA Chew 1,000 mg by mouth daily as needed for heartburn.   EPINEPHrine 0.3 mg/0.3 mL Soaj injection Commonly known as: EpiPen 2-Pak Inject 0.3 mg into the muscle as needed for anaphylaxis.   ibuprofen 200 MG tablet Commonly known as: ADVIL Take 400 mg by mouth daily as needed for headache or moderate pain.   loratadine 10 MG tablet Commonly known as: CLARITIN Take 10 mg by mouth daily as needed for allergies.   multivitamin tablet Take 1 tablet by mouth daily.   Restasis 0.05 % ophthalmic emulsion Generic drug: cycloSPORINE Instill 1 drop into both eyes twice a day   triamcinolone 0.1 % paste Commonly known as: KENALOG Use as directed 1 application in the mouth or throat daily as needed (mouth ulcers).   Vitamin D3 125 MCG (5000 UT) Caps Take 5,000 Units by mouth daily.        Birth History: non-contributory  Developmental History:  non-contributory  Past Surgical History: Past Surgical History:  Procedure Laterality Date   [place]   CERVIX LESION DESTRUCTION     COLONOSCOPY N/A 10/25/2017   multiple small and large-mouthed diverticula in sigmoid colon, external hemorrhoids on retroflexion that were small.    PTOSIS REPAIR Bilateral 08/03/2017   Procedure: INTERNAL PTOSIS REPAIR BILATERAL;  Surgeon: Clista Bernhardt, MD;  Location: [facility];  Service: Ophthalmology;  Laterality: Bilateral;   THORACOTOMY  2005   TONSILLECTOMY     and adenoidectomy   vuvular repair       Family History: Family History  Problem Relation Age of Onset   COPD Mother    Osteoporosis Mother    Hypertension Mother  Hypertension Father    Hypertension Sister    Anxiety disorder Sister    Stroke Maternal Grandmother    Hypertension Maternal Grandmother    Diabetes Maternal Grandmother    Cancer Maternal Grandfather    Heart attack Paternal Grandmother    Kidney failure Paternal Grandfather      Social History: Kalanie lives at home with her family.  She lives in a house that was built in 1977.  There is tile throughout the home.  They have a heat pump for heating and cooling.  They have 1 dog and 1 cat inside of the home.  There are dust mite covers on the bed and the pillows.  There is no tobacco exposure.  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  She is not exposed to fumes, chemicals, or dust.  There is no HEPA filter at home.  They do not live near an interstate or industrial area.   Review of Systems  Constitutional: Negative.  Negative for chills, fever, malaise/fatigue and weight loss.  HENT: Negative.  Negative for congestion, ear discharge and ear pain.   Eyes:  Negative for pain, discharge and redness.  Respiratory:  Negative for cough, sputum production, shortness of breath and wheezing.   Cardiovascular: Negative.  Negative for chest pain and  palpitations.  Gastrointestinal:  Negative for abdominal pain, heartburn, nausea and vomiting.  Skin:  Positive for rash. Negative for itching.  Neurological:  Negative for dizziness and headaches.  Endo/Heme/Allergies:  Negative for environmental allergies. Does not bruise/bleed easily.       Objective:   Blood pressure 116/70, pulse 72, temperature 97.8 F (36.6 C), resp. rate 20, height 5\' 3"  (1.6 m), weight 153 lb (69.4 kg), SpO2 97 %. Body mass index is 27.1 kg/m.     Physical Exam Vitals reviewed.  Constitutional:      Appearance: She is well-developed.     Comments: Pleasant female. Talkative. Curious.   HENT:     Head: Normocephalic and atraumatic.     Right Ear: Tympanic membrane, ear canal and external ear normal. No drainage, swelling or tenderness. Tympanic membrane is not injected, scarred, erythematous, retracted or bulging.     Left Ear: Tympanic membrane, ear canal and external ear normal. No drainage, swelling or tenderness. Tympanic membrane is not injected, scarred, erythematous, retracted or bulging.     Nose: No nasal deformity, septal deviation, mucosal edema or rhinorrhea.     Right Sinus: No maxillary sinus tenderness or frontal sinus tenderness.     Left Sinus: No maxillary sinus tenderness or frontal sinus tenderness.     Mouth/Throat:     Mouth: Mucous membranes are not pale and not dry.     Pharynx: Uvula midline.  Eyes:     General:        Right eye: No discharge.        Left eye: No discharge.     Conjunctiva/sclera: Conjunctivae normal.     Right eye: Right conjunctiva is not injected. No chemosis.    Left eye: Left conjunctiva is not injected. No chemosis.    Pupils: Pupils are equal, round, and reactive to light.  Cardiovascular:     Rate and Rhythm: Normal rate and regular rhythm.     Heart sounds: Normal heart sounds.  Pulmonary:     Effort: Pulmonary effort is normal. No tachypnea, accessory muscle usage or respiratory distress.      Breath sounds: Normal  breath sounds. No wheezing, rhonchi or rales.  Chest:     Chest wall: No tenderness.  Abdominal:     Tenderness: There is no abdominal tenderness. There is no guarding or rebound.  Lymphadenopathy:     Head:     Right side of head: No submandibular, tonsillar or occipital adenopathy.     Left side of head: No submandibular, tonsillar or occipital adenopathy.     Cervical: No cervical adenopathy.  Skin:    Coloration: Skin is not pale.     Findings: No abrasion, erythema, petechiae or rash. Rash is not papular, urticarial or vesicular.  Neurological:     Mental Status: She is alert.  Psychiatric:        Behavior: Behavior is cooperative.      Diagnostic studies: none         Salvatore Marvel, MD [facility]

## 2022-03-17 ENCOUNTER — Other Ambulatory Visit (HOSPITAL_COMMUNITY)
Admission: RE | Admit: 2022-03-17 | Discharge: 2022-03-17 | Disposition: A | Discharge status: Home / Self Care | Payer: 59 | Source: Ambulatory Visit | Attending: Internal Medicine | Admitting: Internal Medicine

## 2022-03-17 DIAGNOSIS — X58XXXA Exposure to other specified factors, initial encounter: Secondary | ICD-10-CM | POA: Diagnosis not present

## 2022-03-17 DIAGNOSIS — Z131 Encounter for screening for diabetes mellitus: Secondary | ICD-10-CM | POA: Diagnosis not present

## 2022-03-17 DIAGNOSIS — T783XXA Angioneurotic edema, initial encounter: Secondary | ICD-10-CM | POA: Diagnosis not present

## 2022-03-17 DIAGNOSIS — E559 Vitamin D deficiency, unspecified: Secondary | ICD-10-CM | POA: Diagnosis not present

## 2022-03-17 DIAGNOSIS — Z13220 Encounter for screening for lipoid disorders: Secondary | ICD-10-CM | POA: Insufficient documentation

## 2022-03-17 LAB — COMPREHENSIVE METABOLIC PANEL
ALT: 21 U/L (ref 0–44)
AST: 25 U/L (ref 15–41)
Albumin: 4.3 g/dL (ref 3.5–5.0)
Alkaline Phosphatase: 91 U/L (ref 38–126)
Anion gap: 12 (ref 5–15)
BUN: 13 mg/dL (ref 6–20)
CO2: 27 mmol/L (ref 22–32)
Calcium: 9 mg/dL (ref 8.9–10.3)
Chloride: 98 mmol/L (ref 98–111)
Creatinine, Ser: 0.89 mg/dL (ref 0.44–1.00)
GFR, Estimated: >60 mL/min (ref >60)
Glucose, Bld: 112 mg/dL — ABNORMAL HIGH (ref 70–99)
Potassium: 3.5 mmol/L (ref 3.5–5.1)
Sodium: 137 mmol/L (ref 135–145)
Total Bilirubin: 0.5 mg/dL (ref 0.3–1.2)
Total Protein: 6.8 g/dL (ref 6.5–8.1)

## 2022-03-17 LAB — CBC WITH DIFFERENTIAL/PLATELET
Abs Immature Granulocytes: 0.01 10*3/uL (ref 0.00–0.07)
Basophils Absolute: 0 10*3/uL (ref 0.0–0.1)
Basophils Relative: 1 %
Eosinophils Absolute: 0.2 10*3/uL (ref 0.0–0.5)
Eosinophils Relative: 3 %
HCT: 44.3 % (ref 36.0–46.0)
Hemoglobin: 14.8 g/dL (ref 12.0–15.0)
Immature Granulocytes: 0 %
Lymphocytes Relative: 33 %
Lymphs Abs: 2.3 10*3/uL (ref 0.7–4.0)
MCH: 31.2 pg (ref 26.0–34.0)
MCHC: 33.4 g/dL (ref 30.0–36.0)
MCV: 93.3 fL (ref 80.0–100.0)
Monocytes Absolute: 0.5 10*3/uL (ref 0.1–1.0)
Monocytes Relative: 8 %
Neutro Abs: 3.9 10*3/uL (ref 1.7–7.7)
Neutrophils Relative %: 55 %
Platelets: 215 10*3/uL (ref 150–400)
RBC: 4.75 MIL/uL (ref 3.87–5.11)
RDW: 12.1 % (ref 11.5–15.5)
WBC: 6.9 10*3/uL (ref 4.0–10.5)
nRBC: 0 % (ref 0.0–0.2)

## 2022-03-17 LAB — LIPID PANEL
Cholesterol: 199 mg/dL (ref 0–200)
HDL: 80 mg/dL (ref >40)
LDL Cholesterol: 101 mg/dL — ABNORMAL HIGH (ref 0–99)
Total CHOL/HDL Ratio: 2.5 RATIO
Triglycerides: 90 mg/dL (ref <150)
VLDL: 18 mg/dL (ref 0–40)

## 2022-03-17 LAB — TSH: TSH: 1.137 u[IU]/mL (ref 0.350–4.500)

## 2022-03-17 LAB — HEMOGLOBIN A1C
Hgb A1c MFr Bld: 4.8 % (ref 4.8–5.6)
Mean Plasma Glucose: 91.06 mg/dL

## 2022-03-17 LAB — VITAMIN D 25 HYDROXY (VIT D DEFICIENCY, FRACTURES): Vit D, 25-Hydroxy: 48.48 ng/mL (ref 30–100)

## 2022-03-18 ENCOUNTER — Other Ambulatory Visit (HOSPITAL_COMMUNITY): Payer: Self-pay

## 2022-03-21 ENCOUNTER — Ambulatory Visit (INDEPENDENT_AMBULATORY_CARE_PROVIDER_SITE_OTHER): Payer: 59 | Admitting: Internal Medicine

## 2022-03-21 ENCOUNTER — Encounter: Payer: Self-pay | Admitting: Internal Medicine

## 2022-03-21 VITALS — BP 114/54 | HR 70 | Ht 63.0 in | Wt 150.8 lb

## 2022-03-21 DIAGNOSIS — Q100 Congenital ptosis: Secondary | ICD-10-CM | POA: Diagnosis not present

## 2022-03-21 DIAGNOSIS — L508 Other urticaria: Secondary | ICD-10-CM

## 2022-03-21 DIAGNOSIS — Z0001 Encounter for general adult medical examination with abnormal findings: Secondary | ICD-10-CM | POA: Diagnosis not present

## 2022-03-21 NOTE — Assessment & Plan Note (Signed)
Had hives in the last month, recurrent - likely from cleaning liquid exposure Tried Claritin/Benadryl as needed, continue for now Has allergy to pet dander, avoid pet exposure if possible

## 2022-03-21 NOTE — Assessment & Plan Note (Signed)
Physical exam as documented. Denies COVID, flu and Shingrix vaccine for now. Gets Pap smear with OB/GYN.

## 2022-03-21 NOTE — Assessment & Plan Note (Signed)
Has had levator palpebrae surgery in the past Has persistent right eye ptosis Used to be followed by Dr. Patrice Paradise

## 2022-03-21 NOTE — Progress Notes (Signed)
New Patient Office Visit  Subjective:  Patient ID: Shelby [name], female    DOB: [birth year]  Age: 58 y.o. MRN: [id number]  CC:  Chief Complaint  Patient presents with   Annual Exam    HPI Shelby [name] is a 57 y.o. female with past medical history of congenital ptosis who presents for establishing care and annual physical.  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  She has been doing well overall.  She uses [facility] for her history of ptosis.  She follows up with Dr. Patrice Paradise for it.  Her hives and lip swelling have resolved now.  She is up-to-date with colonoscopy and Pap smear.  She denies COVID, flu and Shingrix vaccines for now.    Past Medical History:  Diagnosis Date   Hematuria 09/16/2014   Papanicolaou smear of cervix with positive high risk human papilloma virus (HPV) test 05/11/2020   04/2020 will get colpo   URI (upper respiratory infection)    Urinary frequency 09/16/2014   UTI (lower urinary tract infection) 09/16/2014   Vaginal Pap smear, abnormal     Past Surgical History:  Procedure Laterality Date   [place]     COLONOSCOPY N/A 10/25/2017   multiple small and large-mouthed diverticula in sigmoid colon, external hemorrhoids on retroflexion that were small.    PTOSIS REPAIR Bilateral 08/03/2017   Procedure: INTERNAL PTOSIS REPAIR BILATERAL;  Surgeon: Clista Bernhardt, MD;  Location: [facility];  Service: Ophthalmology;  Laterality: Bilateral;   THORACOTOMY  2005   TONSILLECTOMY     and adenoidectomy   vuvular repair      Family History  Problem Relation Age of Onset   COPD Mother    Osteoporosis Mother    Hypertension Mother    Hypertension Father    Hypertension Sister    Anxiety disorder Sister    Stroke Maternal Grandmother    Hypertension Maternal Grandmother    Diabetes Maternal Grandmother    Cancer Maternal Grandfather    Heart attack Paternal Grandmother    Kidney failure Paternal Grandfather      Social History   Socioeconomic History   Marital status: Married    Spouse name: Not on file   Number of children: Not on file   Years of education: Not on file   Highest education level: Not on file  Occupational History   Not on file  Tobacco Use   Smoking status: Former    Packs/day: 1.00    Years: 8.00    Total pack years: 8.00    Types: Cigarettes   Smokeless tobacco: Never   Tobacco comments:    quit in 1996  Vaping Use   Vaping Use: Never used  Substance and Sexual Activity   Alcohol use: Yes    Comment: 3 ounces every 3 weeks   Drug use: No   Sexual activity: Yes    Birth control/protection: Post-menopausal  Other Topics Concern   Not on file  Social History Narrative   Not on file   Social Determinants of Health   Financial Resource Strain: Low Risk  (05/05/2020)   Overall Financial Resource Strain (CARDIA)    Difficulty of Paying Living Expenses: Not hard at all  Food Insecurity: No Food Insecurity (05/05/2020)   Hunger Vital Sign    Worried About Running Out of Food in the Last Year: Never true    [place] in the Last Year: Never  true  Transportation Needs: No Transportation Needs (05/05/2020)   PRAPARE - Hydrologist (Medical): No    Lack of Transportation (Non-Medical): No  Physical Activity: Inactive (05/05/2020)   Exercise Vital Sign    Days of Exercise per Week: 0 days    Minutes of Exercise per Session: 0 min  Stress: No Stress Concern Present (05/05/2020)   [place]    Feeling of Stress : Only a little  Social Connections: Socially Integrated (05/05/2020)   Social Connection and Isolation Panel [NHANES]    Frequency of Communication with Friends and Family: More than three times a week    Frequency of Social Gatherings with Friends and Family: Twice a week    Attends Religious Services: More than 4 times per year    Active Member of [employer] or  Organizations: Yes    Attends Music therapist: More than 4 times per year    Marital Status: Married  Human resources officer Violence: Not At Risk (05/05/2020)   Humiliation, Afraid, Rape, and Kick questionnaire    Fear of Current or Ex-Partner: No    Emotionally Abused: No    Physically Abused: No    Sexually Abused: No    ROS Review of Systems  Constitutional:  Negative for chills and fever.  HENT:  Negative for congestion, sinus pressure, sinus pain and sore throat.   Eyes:  Negative for pain and discharge.  Respiratory:  Negative for cough and shortness of breath.   Cardiovascular:  Negative for chest pain and palpitations.  Gastrointestinal:  Negative for abdominal pain, diarrhea, nausea and vomiting.  Endocrine: Negative for polydipsia and polyuria.  Genitourinary:  Negative for dysuria and hematuria.  Musculoskeletal:  Negative for neck pain and neck stiffness.  Skin:  Negative for rash.  Neurological:  Negative for dizziness and weakness.  Psychiatric/Behavioral:  Negative for agitation and behavioral problems.     Objective:   Today's Vitals: BP (!) 114/54 (BP Location: Left Arm, Patient Position: Sitting, Cuff Size: Normal)   Pulse 70   Ht '5\' 3"'$  (1.6 m)   Wt 150 lb 12.8 oz (68.4 kg)   SpO2 98%   BMI 26.71 kg/m   Physical Exam Vitals reviewed.  Constitutional:      General: She is not in acute distress.    Appearance: She is not diaphoretic.  HENT:     Head: Normocephalic and atraumatic.     Nose: Nose normal.     Mouth/Throat:     Mouth: Mucous membranes are moist.  Eyes:     General: No scleral icterus.    Extraocular Movements: Extraocular movements intact.     Comments: Right sided ptosis  Cardiovascular:     Rate and Rhythm: Normal rate and regular rhythm.     Pulses: Normal pulses.     Heart sounds: Normal heart sounds. No murmur heard. Pulmonary:     Breath sounds: Normal breath sounds. No wheezing or rales.  Abdominal:     Palpations:  Abdomen is soft.     Tenderness: There is no abdominal tenderness.  Musculoskeletal:     Cervical back: Neck supple. No tenderness.     Right lower leg: No edema.     Left lower leg: No edema.  Skin:    General: Skin is warm.     Findings: No rash.  Neurological:     General: No focal deficit present.     Mental Status:  She is alert and oriented to person, place, and time.     Sensory: No sensory deficit.     Motor: No weakness.  Psychiatric:        Mood and Affect: Mood normal.        Behavior: Behavior normal.     Assessment & Plan:   Problem List Items Addressed This Visit    Encounter for general adult medical examination with abnormal findings Physical exam as documented. Denies COVID, flu and Shingrix vaccine for now. Gets Pap smear with OB/GYN.  Physical urticaria Had hives in the last month, recurrent - likely from cleaning liquid exposure Tried Claritin/Benadryl as needed, continue for now Has allergy to pet dander, avoid pet exposure if possible  Congenital ptosis of both eyelids Has had levator palpebrae surgery in the past Has persistent right eye ptosis Used to be followed by Dr. Patrice Paradise   Outpatient Encounter Medications as of 03/21/2022  Medication Sig   calcium elemental as carbonate (BARIATRIC TUMS ULTRA) 400 MG chewable tablet Chew 1,000 mg by mouth daily as needed for heartburn. (Patient not taking: Reported on 02/09/2022)   Cholecalciferol (VITAMIN D3) 5000 units CAPS Take 5,000 Units by mouth daily.    cycloSPORINE (RESTASIS) 0.05 % ophthalmic emulsion Instill 1 drop into both eyes twice a day   EPINEPHrine (EPIPEN 2-PAK) 0.3 mg/0.3 mL IJ SOAJ injection Inject 0.3 mg into the muscle as needed for anaphylaxis.   ibuprofen (ADVIL) 200 MG tablet Take 400 mg by mouth daily as needed for headache or moderate pain.    loratadine (CLARITIN) 10 MG tablet Take 10 mg by mouth daily as needed for allergies.   Multiple Vitamin (MULTIVITAMIN) tablet Take 1 tablet  by mouth daily.   triamcinolone (KENALOG) 0.1 % paste Use as directed 1 application in the mouth or throat daily as needed (mouth ulcers).  (Patient not taking: Reported on 02/09/2022)   No facility-administered encounter medications on file as of 03/21/2022.    Follow-up: Return in about 1 year (around 03/22/2023) for Annual physical.   Lindell Spar, MD

## 2022-03-23 ENCOUNTER — Other Ambulatory Visit (HOSPITAL_COMMUNITY): Payer: Self-pay

## 2022-04-06 ENCOUNTER — Other Ambulatory Visit (HOSPITAL_COMMUNITY): Payer: Self-pay

## 2022-05-19 ENCOUNTER — Ambulatory Visit (INDEPENDENT_AMBULATORY_CARE_PROVIDER_SITE_OTHER): Payer: 59 | Admitting: Obstetrics & Gynecology

## 2022-05-19 ENCOUNTER — Other Ambulatory Visit (HOSPITAL_COMMUNITY)
Admission: RE | Admit: 2022-05-19 | Discharge: 2022-05-19 | Disposition: A | Discharge status: Home / Self Care | Payer: 59 | Source: Ambulatory Visit | Attending: Obstetrics & Gynecology | Admitting: Obstetrics & Gynecology

## 2022-05-19 ENCOUNTER — Encounter: Payer: Self-pay | Admitting: Obstetrics & Gynecology

## 2022-05-19 VITALS — BP 100/69 | HR 69 | Ht 63.75 in | Wt 152.0 lb

## 2022-05-19 DIAGNOSIS — Z01419 Encounter for gynecological examination (general) (routine) without abnormal findings: Secondary | ICD-10-CM | POA: Insufficient documentation

## 2022-05-19 NOTE — Progress Notes (Signed)
Subjective:     Shelby [name] is a 57 y.o. female here for a routine exam.  No LMP recorded. Patient is postmenopausal. G1P0010 Birth Control Method:  menopausal Menstrual Calendar(currently): amenorrhea  Current complaints: none.   Current acute medical issues:  none   Recent Gynecologic History No LMP recorded. Patient is postmenopausal. Last Pap: ASCUS,  +HPV 18 05/17/2021 Last mammogram: 03/24/2021,  normal  Past Medical History:  Diagnosis Date   Hematuria 09/16/2014   Papanicolaou smear of cervix with positive high risk human papilloma virus (HPV) test 05/11/2020   04/2020 will get colpo   URI (upper respiratory infection)    Urinary frequency 09/16/2014   UTI (lower urinary tract infection) 09/16/2014   Vaginal Pap smear, abnormal     Past Surgical History:  Procedure Laterality Date   BELPHAROPTOSIS REPAIR  1969   CERVIX LESION DESTRUCTION     COLONOSCOPY N/A 10/25/2017   multiple small and large-mouthed diverticula in sigmoid colon, external hemorrhoids on retroflexion that were small.    PTOSIS REPAIR Bilateral 08/03/2017   Procedure: INTERNAL PTOSIS REPAIR BILATERAL;  Surgeon: Floydene Flock, MD;  Location: [facility];  Service: Ophthalmology;  Laterality: Bilateral;   THORACOTOMY  2005   TONSILLECTOMY     and adenoidectomy   vuvular repair      OB History     Gravida  1   Para      Term      Preterm      AB  1   Living  0      SAB  1   IAB      Ectopic      Multiple      Live Births  0           Social History   Socioeconomic History   Marital status: Married    Spouse name: Not on file   Number of children: Not on file   Years of education: Not on file   Highest education level: Not on file  Occupational History   Not on file  Tobacco Use   Smoking status: Former    Packs/day: 1.00    Years: 8.00    Additional pack years: 0.00    Total pack years: 8.00    Types: Cigarettes   Smokeless tobacco: Never   Tobacco comments:    quit  in 1996  Vaping Use   Vaping Use: Never used  Substance and Sexual Activity   Alcohol use: Yes    Comment: 3 ounces every 3 weeks   Drug use: No   Sexual activity: Yes    Birth control/protection: Post-menopausal  Other Topics Concern   Not on file  Social History Narrative   Not on file   Social Determinants of Health   Financial Resource Strain: Low Risk  (05/19/2022)   Overall Financial Resource Strain (CARDIA)    Difficulty of Paying Living Expenses: Not hard at all  Food Insecurity: No Food Insecurity (05/19/2022)   Hunger Vital Sign    Worried About Running Out of Food in the Last Year: Never true    Ran Out of Food in the Last Year: Never true  Transportation Needs: No Transportation Needs (05/19/2022)   PRAPARE - Administrator, Civil Service (Medical): No    Lack of Transportation (Non-Medical): No  Physical Activity: Sufficiently Active (05/19/2022)   Exercise Vital Sign    Days of Exercise per Week: 5 days    Minutes of  Exercise per Session: 50 min  Stress: No Stress Concern Present (05/19/2022)   [employer] of Occupational Health - Occupational Stress Questionnaire    Feeling of Stress : Only a little  Social Connections: Moderately Integrated (05/19/2022)   Social Connection and Isolation Panel [NHANES]    Frequency of Communication with Friends and Family: More than three times a week    Frequency of Social Gatherings with Friends and Family: Twice a week    Attends Religious Services: More than 4 times per year    Active Member of [employer] or Organizations: No    Attends Engineer, structural: Never    Marital Status: Married    Family History  Problem Relation Age of Onset   COPD Mother    Osteoporosis Mother    Hypertension Mother    Hypertension Father    Hypertension Sister    Anxiety disorder Sister    Stroke Maternal Grandmother    Hypertension Maternal Grandmother    Diabetes Maternal Grandmother    Cancer Maternal  Grandfather    Heart attack Paternal Grandmother    Kidney failure Paternal Grandfather      Current Outpatient Medications:    cycloSPORINE (RESTASIS) 0.05 % ophthalmic emulsion, Instill 1 drop into both eyes twice a day, Disp: 180 each, Rfl: 3   EPINEPHrine (EPIPEN 2-PAK) 0.3 mg/0.3 mL IJ SOAJ injection, Inject 0.3 mg into the muscle as needed for anaphylaxis., Disp: 1 each, Rfl: 3   calcium elemental as carbonate (BARIATRIC TUMS ULTRA) 400 MG chewable tablet, Chew 1,000 mg by mouth daily as needed for heartburn. (Patient not taking: Reported on 02/09/2022), Disp: , Rfl:    Cholecalciferol (VITAMIN D3) 5000 units CAPS, Take 5,000 Units by mouth daily.  (Patient not taking: Reported on 05/19/2022), Disp: , Rfl:    ibuprofen (ADVIL) 200 MG tablet, Take 400 mg by mouth daily as needed for headache or moderate pain.  (Patient not taking: Reported on 05/19/2022), Disp: , Rfl:    loratadine (CLARITIN) 10 MG tablet, Take 10 mg by mouth daily as needed for allergies. (Patient not taking: Reported on 05/19/2022), Disp: , Rfl:    Multiple Vitamin (MULTIVITAMIN) tablet, Take 1 tablet by mouth daily. (Patient not taking: Reported on 05/19/2022), Disp: , Rfl:    triamcinolone (KENALOG) 0.1 % paste, Use as directed 1 application in the mouth or throat daily as needed (mouth ulcers).  (Patient not taking: Reported on 02/09/2022), Disp: , Rfl:   Review of Systems  Review of Systems  Constitutional: Negative for fever, chills, weight loss, malaise/fatigue and diaphoresis.  HENT: Negative for hearing loss, ear pain, nosebleeds, congestion, sore throat, neck pain, tinnitus and ear discharge.   Eyes: Negative for blurred vision, double vision, photophobia, pain, discharge and redness.  Respiratory: Negative for cough, hemoptysis, sputum production, shortness of breath, wheezing and stridor.   Cardiovascular: Negative for chest pain, palpitations, orthopnea, claudication, leg swelling and PND.  Gastrointestinal:  negative for abdominal pain. Negative for heartburn, nausea, vomiting, diarrhea, constipation, blood in stool and melena.  Genitourinary: Negative for dysuria, urgency, frequency, hematuria and flank pain.  Musculoskeletal: Negative for myalgias, back pain, joint pain and falls.  Skin: Negative for itching and rash.  Neurological: Negative for dizziness, tingling, tremors, sensory change, speech change, focal weakness, seizures, loss of consciousness, weakness and headaches.  Endo/Heme/Allergies: Negative for environmental allergies and polydipsia. Does not bruise/bleed easily.  Psychiatric/Behavioral: Negative for depression, suicidal ideas, hallucinations, memory loss and substance abuse. The patient is not nervous/anxious and  does not have insomnia.        Objective:  Blood pressure 100/69, pulse 69, height 5' 3.75" (1.619 m), weight 152 lb (68.9 kg).   Physical Exam  Vitals reviewed. Constitutional: She is oriented to person, place, and time. She appears well-developed and well-nourished.  HENT:  Head: Normocephalic and atraumatic.        Right Ear: External ear normal.  Left Ear: External ear normal.  Nose: Nose normal.  Mouth/Throat: Oropharynx is clear and moist.  Eyes: Conjunctivae and EOM are normal. Pupils are equal, round, and reactive to light. Right eye exhibits no discharge. Left eye exhibits no discharge. No scleral icterus.  Neck: Normal range of motion. Neck supple. No tracheal deviation present. No thyromegaly present.  Cardiovascular: Normal rate, regular rhythm, normal heart sounds and intact distal pulses.  Exam reveals no gallop and no friction rub.   No murmur heard. Respiratory: Effort normal and breath sounds normal. No respiratory distress. She has no wheezes. She has no rales. She exhibits no tenderness.  GI: Soft. Bowel sounds are normal. She exhibits no distension and no mass. There is no tenderness. There is no rebound and no guarding.  Genitourinary:   Breasts no masses skin changes or nipple changes bilaterally      Vulva is normal without lesions Vagina is pink moist without discharge Cervix normal in appearance and pap is done Uterus is normal size shape and contour Adnexa is negative with normal sized ovaries   Musculoskeletal: Normal range of motion. She exhibits no edema and no tenderness.  Neurological: She is alert and oriented to person, place, and time. She has normal reflexes. She displays normal reflexes. No cranial nerve deficit. She exhibits normal muscle tone. Coordination normal.  Skin: Skin is warm and dry. No rash noted. No erythema. No pallor.  Psychiatric: She has a normal mood and affect. Her behavior is normal. Judgment and thought content normal.       Medications Ordered at today's visit: No orders of the defined types were placed in this encounter.   Other orders placed at today's visit: No orders of the defined types were placed in this encounter.     Assessment:    Normal Gyn exam.   Hx of +HPV 18 Plan:    Follow up 2 years depending on HPV based cytology     Return in about 2 years (around 05/18/2024) for yearly.

## 2022-05-24 ENCOUNTER — Encounter: Payer: Self-pay | Admitting: Obstetrics & Gynecology

## 2022-05-26 LAB — CYTOLOGY - PAP
Comment: NEGATIVE
Comment: NEGATIVE
Comment: NEGATIVE
HPV 16: NEGATIVE
HPV 18 / 45: POSITIVE — AB
High risk HPV: POSITIVE — AB

## 2022-05-31 ENCOUNTER — Encounter: Payer: Self-pay | Admitting: Obstetrics & Gynecology

## 2022-05-31 ENCOUNTER — Ambulatory Visit (INDEPENDENT_AMBULATORY_CARE_PROVIDER_SITE_OTHER): Payer: 59 | Admitting: Obstetrics & Gynecology

## 2022-05-31 VITALS — BP 113/63 | HR 85 | Ht 63.75 in | Wt 152.0 lb

## 2022-05-31 DIAGNOSIS — R87810 Cervical high risk human papillomavirus (HPV) DNA test positive: Secondary | ICD-10-CM

## 2022-05-31 NOTE — Progress Notes (Signed)
    Colposcopy Procedure Note:    Colposcopy Procedure Note  Indications:  2024 Atrophic epithelium with + HPV 18 2023 colposcopy normal with ECC focal LSIL, endocervical glands benign   2019 ASCCP recommendation: colposcopy  Smoker:  No. New sexual partner:  No.   History of abnormal Pap: yes  Procedure Details  The risks and benefits of the procedure and Written informed consent obtained.  Speculum placed in vagina and excellent visualization of cervix achieved, cervix swabbed x 3 with acetic acid solution.  Findings: Adequate colposcopy is noted today.  Cervix: no visible lesions, no mosaicism, no punctation, and no abnormal vasculature; SCJ visualized 360 degrees without lesions and no biopsies taken. Vaginal inspection: normal without visible lesions. Vulvar colposcopy: vulvar colposcopy not performed.  Specimens: none  Complications: none.  Colposcopic Impression: Normal colposcopy, atrophic cervical changes  Plan(Based on 2019 ASCCP recommendations) Follow up HPV based cytology 1 year

## 2022-06-06 ENCOUNTER — Other Ambulatory Visit (HOSPITAL_COMMUNITY): Payer: Self-pay

## 2022-06-06 MED ORDER — CYCLOSPORINE 0.05 % OP EMUL
1.0000 [drp] | Freq: Two times a day (BID) | OPHTHALMIC | 12 refills | Status: AC
  Filled 2022-06-06 – 2022-06-08 (×2): qty 180, 90d supply, fill #0
  Filled 2022-09-03: qty 180, 90d supply, fill #1
  Filled 2022-12-01: qty 180, 90d supply, fill #2
  Filled 2023-03-14: qty 180, 90d supply, fill #3
  Filled 2023-05-18: qty 180, 90d supply, fill #4
  Filled ????-??-??: fill #4

## 2022-06-08 ENCOUNTER — Other Ambulatory Visit (HOSPITAL_COMMUNITY): Payer: Self-pay

## 2022-07-18 ENCOUNTER — Other Ambulatory Visit (HOSPITAL_COMMUNITY): Payer: Self-pay

## 2022-08-22 ENCOUNTER — Encounter: Payer: Self-pay | Admitting: Internal Medicine

## 2022-09-04 ENCOUNTER — Other Ambulatory Visit: Payer: Self-pay

## 2022-09-06 DIAGNOSIS — D225 Melanocytic nevi of trunk: Secondary | ICD-10-CM | POA: Diagnosis not present

## 2022-09-06 DIAGNOSIS — Z1283 Encounter for screening for malignant neoplasm of skin: Secondary | ICD-10-CM | POA: Diagnosis not present

## 2022-11-22 ENCOUNTER — Encounter: Payer: Self-pay | Admitting: Family Medicine

## 2022-11-22 ENCOUNTER — Ambulatory Visit (INDEPENDENT_AMBULATORY_CARE_PROVIDER_SITE_OTHER): Payer: 59 | Admitting: Family Medicine

## 2022-11-22 VITALS — BP 118/77 | HR 75 | Resp 16 | Ht 63.75 in | Wt 152.4 lb

## 2022-11-22 DIAGNOSIS — L299 Pruritus, unspecified: Secondary | ICD-10-CM | POA: Diagnosis not present

## 2022-11-22 NOTE — Patient Instructions (Signed)
        Great to see you today.  I have refilled the medication(s) we provide.    - Please take medications as prescribed. - Follow up with your primary health provider if any health concerns arises. - If symptoms worsen please contact your primary care provider and/or visit the emergency department.  

## 2022-11-22 NOTE — Assessment & Plan Note (Addendum)
A referral has been placed for allergy testing. Advised the patient to apply a cold compress and to keep the skin moisturized with fragrance-free lotion. Over-the-counter antihistamines or hydrocortisone cream may be used for additional relief if symptoms continue. The patient was instructed to follow up if symptoms worsen or if there are any signs of infection. The patient agreed with the proposed plan of care.

## 2022-11-22 NOTE — Progress Notes (Signed)
Established Patient Office Visit   [facility]  Subjective  Patient ID: Shelby [name], female    DOB: 1965-06-15  Age: 57 y.o. MRN: [id number]  Chief Complaint  Patient presents with   Insect Bite    Has been itching in the sides of her head and sometimes all over and have been itching like crazy and sometimes sees small red points. Has been stressed out due to husbands recent surgery and doesn't think its scabies. Took claritin but not helping     She  has a past medical history of Hematuria (09/16/2014), Papanicolaou smear of cervix with positive high risk human papilloma virus (HPV) test (05/11/2020), URI (upper respiratory infection), Urinary frequency (09/16/2014), UTI (lower urinary tract infection) (09/16/2014), and Vaginal Pap smear, abnormal.  The patient reported noticing an insect bite on Sunday while cleaning plants in her pond, where she observed bugs and suspects the bite occurred at that time. She mentioned that itching on her head began approximately two months ago. Her current symptoms include itching without any swelling, with gradual progression and no changes in appearance or signs of spreading redness or increased warmth around the bite. She has no known allergies to insect bites or stings and has not reported any previous allergic reactions. Her exposure was likely outdoors near the pond, though she is unsure about the specific insect involved. She denies any recent fever, muscle pain, or swollen lymph nodes and has no known underlying health conditions that may affect her reaction to insect bites. For treatment, the patient has previously used lavender oil for similar reactions, though the efficacy of this or other treatments, such as antihistamines or topical creams, is not detailed. Her history of similar reactions includes prior use of lavender oil, with no indication of severe past responses.    Review of Systems  Constitutional:  Negative for chills and fever.  Eyes:   Negative for blurred vision.  Respiratory:  Negative for shortness of breath.   Cardiovascular:  Negative for chest pain.  Gastrointestinal:  Negative for abdominal pain.  Genitourinary:  Negative for dysuria.  Musculoskeletal:  Negative for myalgias.  Skin:  Positive for itching and rash.  Neurological:  Negative for dizziness and headaches.      Objective:     BP 118/77   Pulse 75   Resp 16   Ht 5' 3.75" (1.619 m)   Wt 152 lb 6.4 oz (69.1 kg)   SpO2 97%   BMI 26.36 kg/m  BP Readings from Last 3 Encounters:  11/22/22 118/77  05/31/22 113/63  05/19/22 100/69      Physical Exam Vitals reviewed.  Constitutional:      General: She is not in acute distress.    Appearance: Normal appearance. She is not ill-appearing, toxic-appearing or diaphoretic.  HENT:     Head: Normocephalic.  Eyes:     General:        Right eye: No discharge.        Left eye: No discharge.     Conjunctiva/sclera: Conjunctivae normal.  Cardiovascular:     Rate and Rhythm: Normal rate.     Pulses: Normal pulses.     Heart sounds: Normal heart sounds.  Pulmonary:     Effort: Pulmonary effort is normal. No respiratory distress.     Breath sounds: Normal breath sounds.  Abdominal:     Tenderness: There is no abdominal tenderness. There is no guarding.  Musculoskeletal:        General: Normal range of  motion.  Skin:    General: Skin is warm and dry.     Capillary Refill: Capillary refill takes less than 2 seconds.     Comments: Bilateral legs present areas slight erythema, excoriations from scratch marks. No pus or swelling noted.  Neurological:     Mental Status: She is alert.     Coordination: Coordination normal.     Gait: Gait normal.  Psychiatric:        Mood and Affect: Mood normal.      No results found for any visits on 11/22/22.  The 10-year ASCVD risk score (Arnett DK, et al., 2019) is: 1.5%    Assessment & Plan:  [facility] Assessment & Plan: A referral has been placed for  allergy testing. Advised the patient to apply a cold compress and to keep the skin moisturized with fragrance-free lotion. Over-the-counter antihistamines or hydrocortisone cream may be used for additional relief if symptoms continue. The patient was instructed to follow up if symptoms worsen or if there are any signs of infection. The patient agreed with the proposed plan of care.  Orders: -     Ambulatory referral to Allergy    Return if symptoms worsen or fail to improve.   Cruzita Lederer Newman Nip, FNP

## 2022-11-24 ENCOUNTER — Other Ambulatory Visit: Payer: Self-pay

## 2022-11-24 ENCOUNTER — Encounter: Payer: Self-pay | Admitting: Family Medicine

## 2022-11-24 ENCOUNTER — Other Ambulatory Visit: Payer: Self-pay | Admitting: Family Medicine

## 2022-11-24 ENCOUNTER — Other Ambulatory Visit (HOSPITAL_COMMUNITY): Payer: Self-pay

## 2022-11-24 MED ORDER — HYDROXYZINE PAMOATE 25 MG PO CAPS
25.0000 mg | ORAL_CAPSULE | Freq: Two times a day (BID) | ORAL | 2 refills | Status: DC | PRN
  Filled 2022-11-24: qty 30, 15d supply, fill #0

## 2022-11-24 MED ORDER — HYDROXYZINE PAMOATE 25 MG PO CAPS: 25.0000 mg | ORAL_CAPSULE | Freq: Two times a day (BID) | ORAL | 2 refills | Status: DC | PRN

## 2022-11-24 NOTE — Telephone Encounter (Signed)
Pt needs medication sent to [facility]

## 2022-11-24 NOTE — Telephone Encounter (Signed)
Sent!

## 2022-11-24 NOTE — Progress Notes (Signed)
Spoke with pt. She is having trouble getting to [facility] to pick up medication. She has requested that Hydroxyzine please be sent in to [facility]  today as she is unable to get to original pharmacy for this particular medication as she is very itchy and uncomfortable. Resent medication to preferred pharmacy.

## 2022-11-24 NOTE — Telephone Encounter (Signed)
Pt called again to request Rx to be sent to appropriate pharmacy. Pt would like to receive Rx prior to EOD.

## 2022-12-01 ENCOUNTER — Other Ambulatory Visit: Payer: Self-pay

## 2022-12-01 ENCOUNTER — Other Ambulatory Visit (HOSPITAL_COMMUNITY): Payer: Self-pay

## 2023-01-04 ENCOUNTER — Encounter: Payer: Self-pay | Admitting: Allergy & Immunology

## 2023-01-04 ENCOUNTER — Encounter: Payer: 59 | Admitting: Allergy & Immunology

## 2023-01-04 ENCOUNTER — Ambulatory Visit: Payer: 59 | Admitting: Allergy & Immunology

## 2023-01-04 ENCOUNTER — Other Ambulatory Visit: Payer: Self-pay

## 2023-01-04 VITALS — BP 102/76 | HR 105 | Temp 98.2°F | Resp 18 | Wt 157.1 lb

## 2023-01-04 DIAGNOSIS — R220 Localized swelling, mass and lump, head: Secondary | ICD-10-CM

## 2023-01-04 DIAGNOSIS — L508 Other urticaria: Secondary | ICD-10-CM | POA: Diagnosis not present

## 2023-01-04 DIAGNOSIS — J302 Other seasonal allergic rhinitis: Secondary | ICD-10-CM

## 2023-01-04 MED ORDER — TRIAMCINOLONE ACETONIDE 0.1 % EX OINT
1.0000 | TOPICAL_OINTMENT | Freq: Two times a day (BID) | CUTANEOUS | 1 refills | Status: AC
  Filled 2023-01-04: qty 454, 90d supply, fill #0
  Filled 2023-04-03: qty 454, 90d supply, fill #1

## 2023-01-04 NOTE — Progress Notes (Signed)
FOLLOW UP  Date of Service/Encounter:  01/04/23   Assessment:   Physical urticaria - with recent flare following exposure to some unknown trigger outdoors, possibly with some relation to stress   Lip swelling - very isolated episodes   Seasonal allergic rhinitis - did not do testing since her symptoms have not very frequent   Preference for no medications  Plan/Recommendations:   1. Physical urticaria with lip swelling - I think that this is ok to still avoid the labs and testing. - I would add on triamcinolone ointment mixed with an over the counter moisturizer twice daily for 1-2 weeks. - Combine this with the use of the antihistamines twice daily as you are doing.  - Wynelle Fanny is an excellent option.   2. Return in about 6 months (around 08/10/2022) or earlier as needed.   Subjective:   Shelby [name] is a 57 y.o. female presenting today for follow up of  Chief Complaint  Patient presents with   Follow-up    Possible bug bites- started itching in October- used zyrtec/claritin- sulfar lavender soap seemed to help     [employer] has a history of the following: Patient Active Problem List   Diagnosis Date Noted   Pruritus 11/22/2022   Seasonal allergic rhinitis 02/09/2022   Angio-edema 02/04/2022   Physical urticaria 02/04/2022   Refused influenza vaccine 03/24/2021   Congenital ptosis of both eyelids 03/19/2021   Papanicolaou smear of cervix with positive high risk human papilloma virus (HPV) test 05/11/2020   Encounter for general adult medical examination with abnormal findings 11/22/2018   Grade II hemorrhoids 10/05/2018   Family history of colon cancer 06/01/2017    History obtained from: chart review and patient.  Discussed the use of AI scribe software for clinical note transcription with the patient and/or guardian, who gave verbal consent to proceed.  Shelby [name] is a 57 y.o. female presenting for a follow up visit.  We last saw her in January 2024.  At that  time, her reactions were rather infrequent.  We did talk about doing some allergy testing, but her rhinitis symptoms and food symptoms were minimal and we felt that this would be a waste of time and money.  We did talk whether its blood work to rule out weird causes of swelling as well.  In the interim, she has mostly done well until around 2 months ago.   She presents with complaints of persistent and intense itching, described as 'orgasmic.' The itching was severe enough to cause self-inflicted bruising from scratching. The patient denies using any topical steroids for symptom management. The patient sought advice from her primary care physician who recommended seeing a neurologist. The patient attempted to manage the symptoms with lavender oil, but found no relief. The patient also tried Zyrtec at night and Claritin in the morning, which provided relief for a couple of weeks. The patient then tried sulfur and lavender bar soap, which seemed to help with the spread of the itching. The patient also noticed an area of itch that had been present for two months prior to the onset of the urticaria, which resolved after using the sulfur and lavender soap. The patient denies any associated rash. No one hsa been able to find any rash when she talks to colleagues (she works as an Charity fundraiser with Palliative Care).   The patient has been managing the pain with lotion. The patient believes the itching and bruising to be self-limiting and has been trying to  control the urge to scratch. The patient reports that the itching has now localized to a specific area on the right side. The patient has been using Sarna lotion to manage the itching and has noticed a cooling sensation when applied to the still affected area.  The patient has been under significant stress recently, with her husband undergoing knee replacement surgery, a cracked tooth requiring dental intervention, and the loss of her dog to liver cancer. The patient also  mentions a significant landscaping project involving the installation of a pond, which required her to be in close contact with plants and potential insect activity. The patient believes the urticaria may have been triggered by a bug bite, despite medical advice suggesting otherwise. The patient has been managing the stress through creative outlets such as flower making (out of dishes) and glass art. She has a YouTube channel that shows all of her projects. It is listed under her name.   Otherwise, there have been no changes to her past medical history, surgical history, family history, or social history.    Review of systems otherwise negative other than that mentioned in the HPI.    Objective:   Blood pressure 102/76, pulse (!) 105, temperature 98.2 F (36.8 C), resp. rate 18, weight 157 lb 2 oz (71.3 kg), SpO2 96%. Body mass index is 27.18 kg/m.    Physical Exam Vitals reviewed.  Constitutional:      Appearance: She is well-developed.     Comments: Pleasant female. Talkative. Curious.   HENT:     Head: Normocephalic and atraumatic.     Right Ear: Tympanic membrane, ear canal and external ear normal. No drainage, swelling or tenderness. Tympanic membrane is not injected, scarred, erythematous, retracted or bulging.     Left Ear: Tympanic membrane, ear canal and external ear normal. No drainage, swelling or tenderness. Tympanic membrane is not injected, scarred, erythematous, retracted or bulging.     Nose: No nasal deformity, septal deviation, mucosal edema or rhinorrhea.     Right Sinus: No maxillary sinus tenderness or frontal sinus tenderness.     Left Sinus: No maxillary sinus tenderness or frontal sinus tenderness.     Mouth/Throat:     Mouth: Mucous membranes are not pale and not dry.     Pharynx: Uvula midline.  Eyes:     General:        Right eye: No discharge.        Left eye: No discharge.     Conjunctiva/sclera: Conjunctivae normal.     Right eye: Right conjunctiva  is not injected. No chemosis.    Left eye: Left conjunctiva is not injected. No chemosis.    Pupils: Pupils are equal, round, and reactive to light.  Cardiovascular:     Rate and Rhythm: Normal rate and regular rhythm.     Heart sounds: Normal heart sounds.  Pulmonary:     Effort: Pulmonary effort is normal. No tachypnea, accessory muscle usage or respiratory distress.     Breath sounds: Normal breath sounds. No wheezing, rhonchi or rales.  Chest:     Chest wall: No tenderness.  Lymphadenopathy:     Head:     Right side of head: No submandibular, tonsillar or occipital adenopathy.     Left side of head: No submandibular, tonsillar or occipital adenopathy.     Cervical: No cervical adenopathy.  Skin:    General: Skin is warm.     Capillary Refill: Capillary refill takes less than 2 seconds.  Coloration: Skin is not pale.     Findings: Rash present. No abrasion, erythema or petechiae. Rash is not papular, urticarial or vesicular.     Comments: She does have some excoriations noted on her bilateral thighs.   Neurological:     Mental Status: She is alert.  Psychiatric:        Behavior: Behavior is cooperative.      Diagnostic studies: none      Malachi Bonds, MD  [facility]

## 2023-01-04 NOTE — Patient Instructions (Addendum)
1. Physical urticaria with lip swelling - I think that this is ok to still avoid the labs and testing. - I would add on triamcinolone ointment mixed with an over the counter moisturizer twice daily for 1-2 weeks. - Combine this with the use of the antihistamines twice daily as you are doing.  - Shelby [name] is an excellent option.   2. Return in about 6 months (around 08/10/2022) or earlier as needed.     Please inform us of any Emergency Department visits, hospitalizations, or changes in symptoms. Call us before going to the ED for breathing or allergy symptoms since we might be able to fit you in for a sick visit. Feel free to contact us anytime with any questions, problems, or concerns.  It was a pleasure to see you again today!  Websites that have reliable patient information: 1. American Academy of Asthma, Allergy, and Immunology: www.aaaai.org 2. Food Allergy Research and Education (FARE): foodallergy.org 3. Mothers of Asthmatics: http://www.asthmacommunitynetwork.org 4. American College of Allergy, Asthma, and Immunology: www.acaai.org   COVID-19 Vaccine Information can be found at: [email or web address].[email or web address] For questions related to vaccine distribution or appointments, please email [email or web address] or call [phone].   We realize that you might be concerned about having an allergic reaction to the COVID19 vaccines. To help with that concern, WE ARE OFFERING THE COVID19 VACCINES IN OUR OFFICE! Ask the front desk for dates!     "Like" [place] on Facebook and Instagram for our latest updates!      A healthy democracy works best when [employer] participate! Make sure you are registered to vote! If you have moved or changed any of your contact information, you will need to get this updated before voting!  In some cases, you MAY be able to register to vote online: [email or web address]

## 2023-01-05 NOTE — Progress Notes (Signed)
This encounter was created in error - please disregard.

## 2023-03-14 ENCOUNTER — Other Ambulatory Visit (HOSPITAL_COMMUNITY): Payer: Self-pay

## 2023-03-15 ENCOUNTER — Other Ambulatory Visit (HOSPITAL_COMMUNITY): Payer: Self-pay

## 2023-03-23 ENCOUNTER — Encounter: Payer: 59 | Admitting: Internal Medicine

## 2023-04-04 ENCOUNTER — Other Ambulatory Visit (HOSPITAL_COMMUNITY): Payer: Self-pay

## 2023-04-06 ENCOUNTER — Other Ambulatory Visit (HOSPITAL_COMMUNITY): Payer: Self-pay | Admitting: Internal Medicine

## 2023-04-06 DIAGNOSIS — Z1231 Encounter for screening mammogram for malignant neoplasm of breast: Secondary | ICD-10-CM

## 2023-04-13 ENCOUNTER — Encounter (HOSPITAL_COMMUNITY): Payer: Self-pay

## 2023-04-13 ENCOUNTER — Ambulatory Visit (HOSPITAL_COMMUNITY)
Admission: RE | Admit: 2023-04-13 | Discharge: 2023-04-13 | Disposition: A | Discharge status: Home / Self Care | Payer: Commercial Managed Care - PPO | Source: Ambulatory Visit

## 2023-04-13 ENCOUNTER — Other Ambulatory Visit: Payer: Self-pay | Admitting: Family Medicine

## 2023-04-13 DIAGNOSIS — Z1231 Encounter for screening mammogram for malignant neoplasm of breast: Secondary | ICD-10-CM | POA: Insufficient documentation

## 2023-05-09 IMAGING — MG MM DIGITAL SCREENING BILAT W/ TOMO AND CAD
6 of 10 series · 6 of 30 positions shown · non-contrast
Comparison: Previous exam(s).

CLINICAL DATA: Screening.

EXAM:
DIGITAL SCREENING BILATERAL MAMMOGRAM WITH TOMOSYNTHESIS AND CAD
TECHNIQUE: Bilateral screening digital craniocaudal and mediolateral oblique
mammograms were obtained. Bilateral screening digital breast
tomosynthesis was performed. The images were evaluated with
computer-aided detection.

[L CC synth-2D]
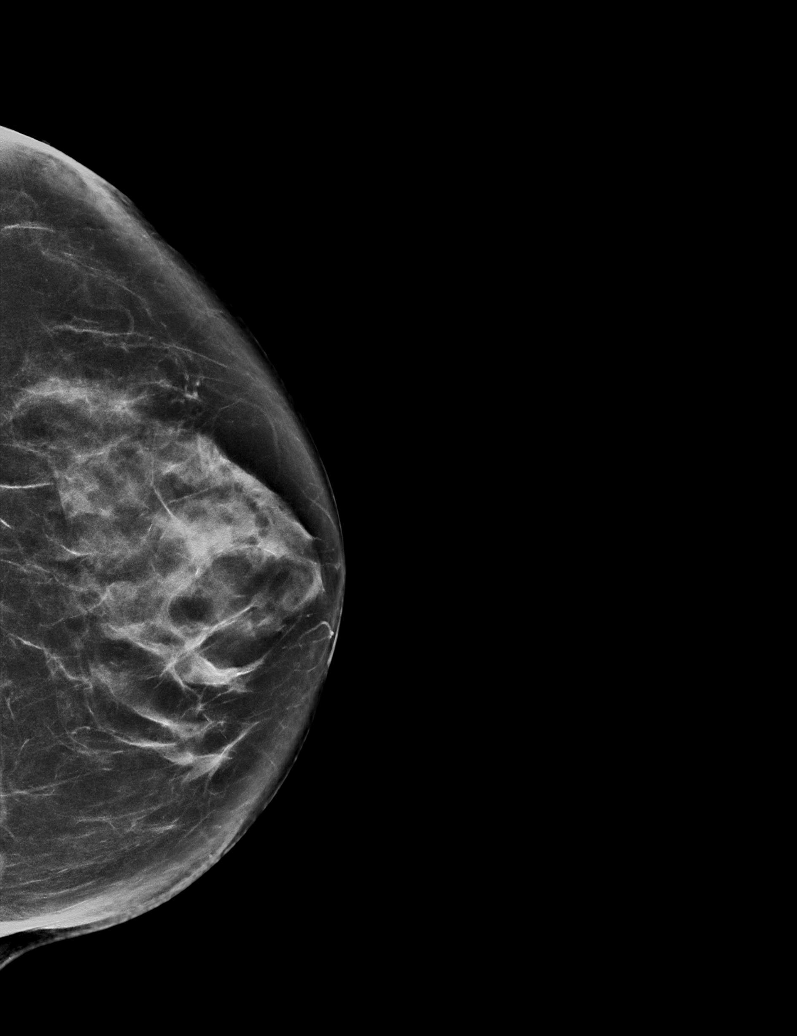

[R MLO synth-2D]
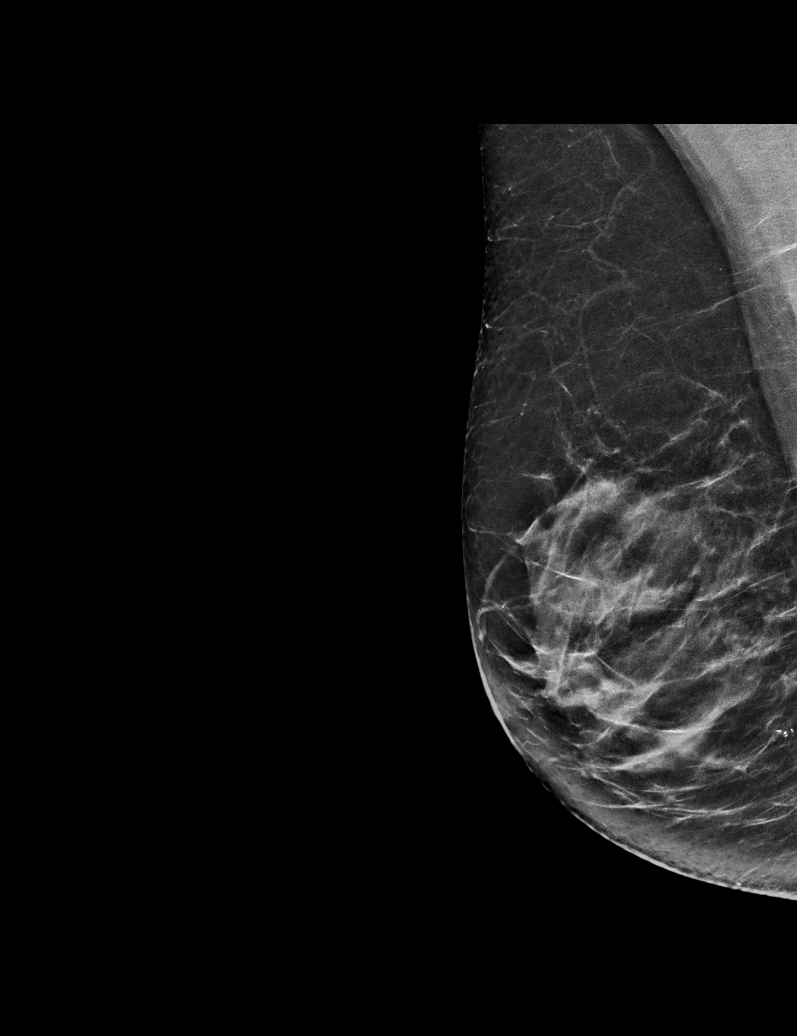

[R CC synth-2D]
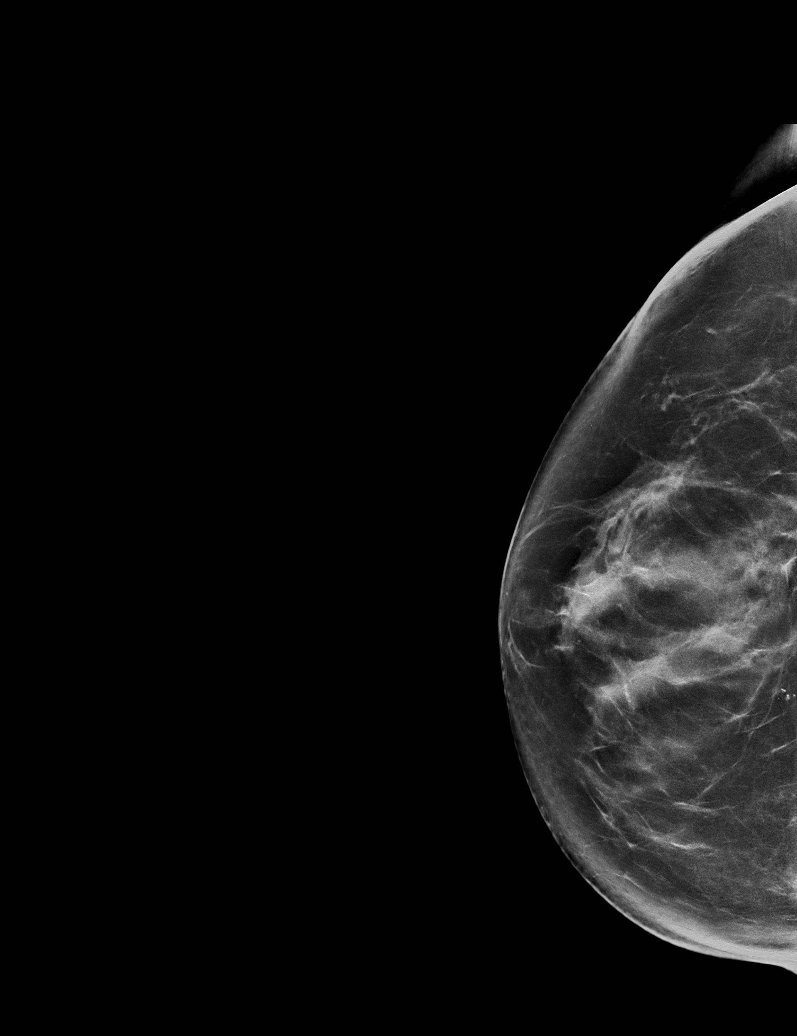

[L MLO synth-2D (1 of 2)]
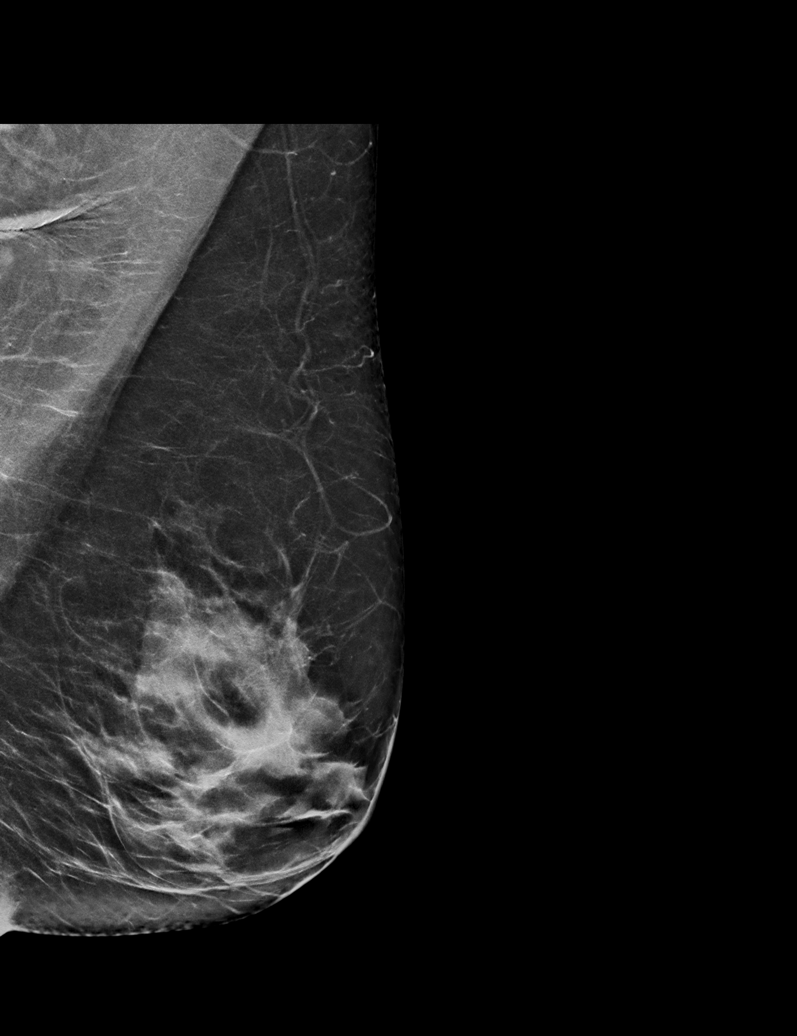

[L MLO synth-2D (2 of 2)]
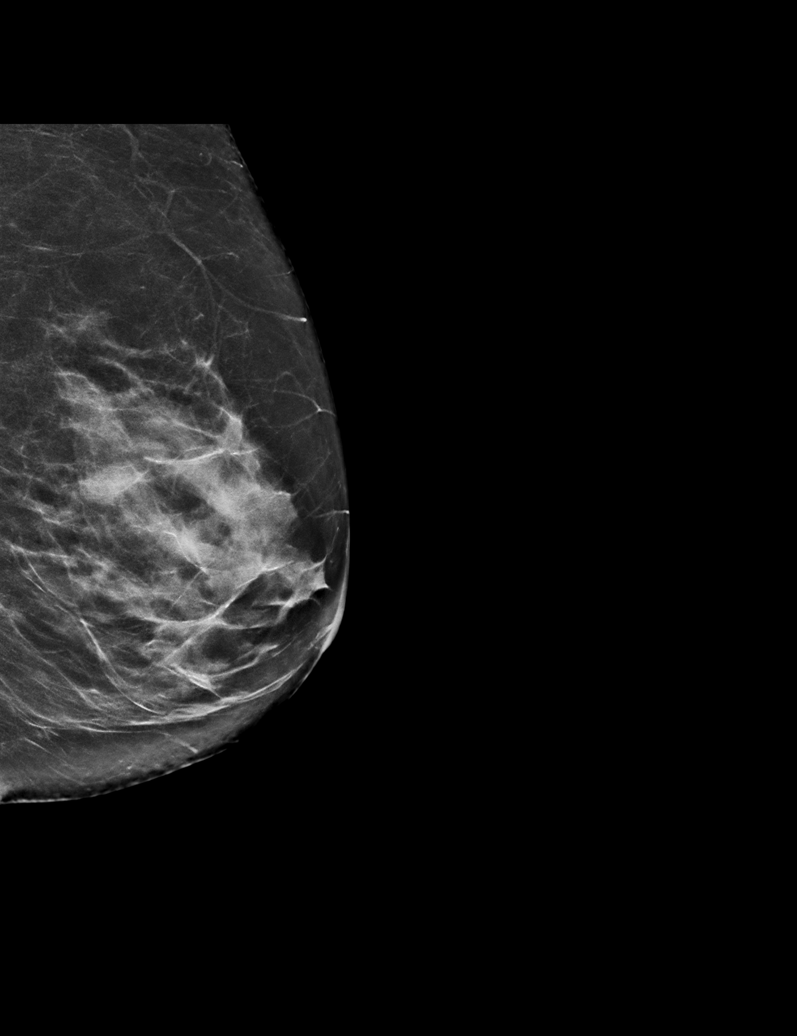

[R CC tomo · tomo slice 39/77.0]
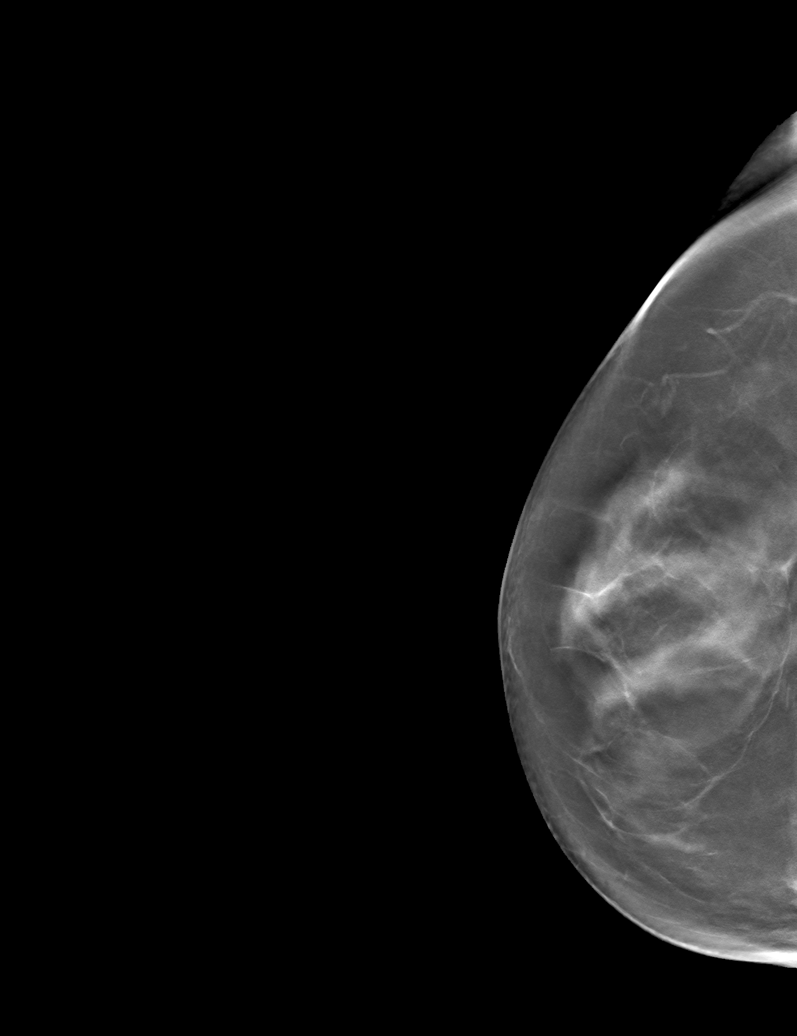

[6 of 30 positions shown; findings below may reference images not displayed]

ACR Breast Density Category c: The breast tissue is heterogeneously
dense, which may obscure small masses.
FINDINGS: There are no findings suspicious for malignancy.
IMPRESSION: No mammographic evidence of malignancy. A result letter of this
screening mammogram will be mailed directly to the patient.

RECOMMENDATION:
Screening mammogram in one year. (Code:[id number])

BI-RADS CATEGORY  1: Negative.

## 2023-05-18 ENCOUNTER — Encounter: Payer: Self-pay | Admitting: Internal Medicine

## 2023-05-18 ENCOUNTER — Ambulatory Visit (INDEPENDENT_AMBULATORY_CARE_PROVIDER_SITE_OTHER): Payer: 59 | Admitting: Internal Medicine

## 2023-05-18 ENCOUNTER — Other Ambulatory Visit (HOSPITAL_COMMUNITY): Payer: Self-pay

## 2023-05-18 VITALS — BP 97/60 | HR 73 | Resp 15 | Ht 63.0 in | Wt 153.0 lb

## 2023-05-18 DIAGNOSIS — E785 Hyperlipidemia, unspecified: Secondary | ICD-10-CM | POA: Insufficient documentation

## 2023-05-18 DIAGNOSIS — Q100 Congenital ptosis: Secondary | ICD-10-CM | POA: Diagnosis not present

## 2023-05-18 DIAGNOSIS — R739 Hyperglycemia, unspecified: Secondary | ICD-10-CM

## 2023-05-18 DIAGNOSIS — L508 Other urticaria: Secondary | ICD-10-CM | POA: Diagnosis not present

## 2023-05-18 DIAGNOSIS — E559 Vitamin D deficiency, unspecified: Secondary | ICD-10-CM | POA: Diagnosis not present

## 2023-05-18 DIAGNOSIS — Z0001 Encounter for general adult medical examination with abnormal findings: Secondary | ICD-10-CM | POA: Diagnosis not present

## 2023-05-18 NOTE — Assessment & Plan Note (Signed)
 Had hives, recurrent - likely from cleaning liquid exposure Tried Claritin/Benadryl  as needed, continue for now Has allergy to pet dander, avoid pet exposure if possible Followed by allergy and immunology clinic

## 2023-05-18 NOTE — Assessment & Plan Note (Signed)
Has had levator palpebrae surgery in the past Has persistent right eye ptosis Used to be followed by Dr. Patrice Paradise

## 2023-05-18 NOTE — Assessment & Plan Note (Signed)
Physical exam as documented. Denies COVID, flu and Shingrix vaccine for now. Gets Pap smear with OB/GYN. 

## 2023-05-18 NOTE — Progress Notes (Signed)
 New Patient Office Visit  Subjective:  Patient ID: Shelby [name], female    DOB: Aug 22, 1965  Age: 58 y.o. MRN: [id number]  CC:  Chief Complaint  Patient presents with   Annual Exam    HPI Shelby [name] is a 58 y.o. female with past medical history of congenital ptosis who presents for establishing care and annual physical.  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  She has been doing well overall.  She uses Restasis  for her history of ptosis.  She follows up with Dr. Faylene Hoots for it.  Her hives and lip swelling have resolved now.  She is up-to-date with colonoscopy and Pap smear.  She denies COVID, flu and Shingrix vaccines for now.    Past Medical History:  Diagnosis Date   Hematuria 09/16/2014   Papanicolaou smear of cervix with positive high risk human papilloma virus (HPV) test 05/11/2020   04/2020 will get colpo   URI (upper respiratory infection)    Urinary frequency 09/16/2014   UTI (lower urinary tract infection) 09/16/2014   Vaginal Pap smear, abnormal     Past Surgical History:  Procedure Laterality Date   BELPHAROPTOSIS REPAIR  1969   CERVIX LESION DESTRUCTION     COLONOSCOPY N/A 10/25/2017   multiple small and large-mouthed diverticula in sigmoid colon, external hemorrhoids on retroflexion that were small.    PTOSIS REPAIR Bilateral 08/03/2017   Procedure: INTERNAL PTOSIS REPAIR BILATERAL;  Surgeon: Karan Osgood, MD;  Location: [facility] OR;  Service: Ophthalmology;  Laterality: Bilateral;   THORACOTOMY  2005   TONSILLECTOMY     and adenoidectomy   vuvular repair      Family History  Problem Relation Age of Onset   COPD Mother    Osteoporosis Mother    Hypertension Mother    Hypertension Father    Hypertension Sister    Anxiety disorder Sister    Stroke Maternal Grandmother    Hypertension Maternal Grandmother    Diabetes Maternal Grandmother    Cancer Maternal Grandfather    Heart attack Paternal Grandmother    Kidney failure Paternal Grandfather      Social History   Socioeconomic History   Marital status: Married    Spouse name: Not on file   Number of children: Not on file   Years of education: Not on file   Highest education level: Not on file  Occupational History   Not on file  Tobacco Use   Smoking status: Former    Current packs/day: 1.00    Average packs/day: 1 pack/day for 8.0 years (8.0 ttl pk-yrs)    Types: Cigarettes   Smokeless tobacco: Never   Tobacco comments:    quit in 1996  Vaping Use   Vaping status: Never Used  Substance and Sexual Activity   Alcohol use: Yes    Comment: 3 ounces every 3 weeks   Drug use: No   Sexual activity: Yes    Birth control/protection: Post-menopausal  Other Topics Concern   Not on file  Social History Narrative   Not on file   Social Drivers of Health   Financial Resource Strain: Low Risk  (05/19/2022)   Overall Financial Resource Strain (CARDIA)    Difficulty of Paying Living Expenses: Not hard at all  Food Insecurity: No Food Insecurity (05/19/2022)   Hunger Vital Sign    Worried About Running Out of Food in the Last Year: Never true    Ran Out of Food in the Last  Year: Never true  Transportation Needs: No Transportation Needs (05/19/2022)   PRAPARE - Administrator, Civil Service (Medical): No    Lack of Transportation (Non-Medical): No  Physical Activity: Sufficiently Active (05/19/2022)   Exercise Vital Sign    Days of Exercise per Week: 5 days    Minutes of Exercise per Session: 50 min  Stress: No Stress Concern Present (05/19/2022)   [employer] of Occupational Health - Occupational Stress Questionnaire    Feeling of Stress : Only a little  Social Connections: Moderately Integrated (05/19/2022)   Social Connection and Isolation Panel [NHANES]    Frequency of Communication with Friends and Family: More than three times a week    Frequency of Social Gatherings with Friends and Family: Twice a week    Attends Religious Services: More than 4  times per year    Active Member of [employer] or Organizations: No    Attends Banker Meetings: Never    Marital Status: Married  Catering manager Violence: Not At Risk (05/19/2022)   Humiliation, Afraid, Rape, and Kick questionnaire    Fear of Current or Ex-Partner: No    Emotionally Abused: No    Physically Abused: No    Sexually Abused: No    ROS Review of Systems  Constitutional:  Negative for chills and fever.  HENT:  Negative for congestion, sinus pressure, sinus pain and sore throat.   Eyes:  Negative for pain and discharge.  Respiratory:  Negative for cough and shortness of breath.   Cardiovascular:  Negative for chest pain and palpitations.  Gastrointestinal:  Negative for abdominal pain, diarrhea, nausea and vomiting.  Endocrine: Negative for polydipsia and polyuria.  Genitourinary:  Negative for dysuria and hematuria.  Musculoskeletal:  Negative for neck pain and neck stiffness.  Skin:  Negative for rash.  Allergic/Immunologic: Positive for environmental allergies.  Neurological:  Negative for dizziness and weakness.  Psychiatric/Behavioral:  Negative for agitation and behavioral problems.     Objective:   Today's Vitals: BP 97/60   Pulse 73   Resp 15   Ht 5\' 3"  (1.6 m)   Wt 153 lb (69.4 kg)   SpO2 97%   BMI 27.10 kg/m   Physical Exam Vitals reviewed.  Constitutional:      General: She is not in acute distress.    Appearance: She is not diaphoretic.  HENT:     Head: Normocephalic and atraumatic.     Nose: Nose normal.     Mouth/Throat:     Mouth: Mucous membranes are moist.  Eyes:     General: No scleral icterus.    Extraocular Movements: Extraocular movements intact.     Comments: Right sided ptosis  Cardiovascular:     Rate and Rhythm: Normal rate and regular rhythm.     Heart sounds: Normal heart sounds. No murmur heard. Pulmonary:     Breath sounds: Normal breath sounds. No wheezing or rales.  Abdominal:     Palpations: Abdomen is  soft.     Tenderness: There is no abdominal tenderness.  Musculoskeletal:     Cervical back: Neck supple. No tenderness.     Right lower leg: No edema.     Left lower leg: No edema.  Skin:    General: Skin is warm.     Findings: No rash.  Neurological:     General: No focal deficit present.     Mental Status: She is alert and oriented to person, place, and time.  Sensory: No sensory deficit.     Motor: No weakness.  Psychiatric:        Mood and Affect: Mood normal.        Behavior: Behavior normal.     Assessment & Plan:   Problem List Items Addressed This Visit    Problem List Items Addressed This Visit       Musculoskeletal and Integument   Physical urticaria   Had hives, recurrent - likely from cleaning liquid exposure Tried Claritin/Benadryl  as needed, continue for now Has allergy to pet dander, avoid pet exposure if possible Followed by allergy and immunology clinic      Relevant Orders   CBC with Differential/Platelet   CMP14+EGFR   TSH     Other   Encounter for general adult medical examination with abnormal findings - Primary   Physical exam as documented. Denies COVID, flu and Shingrix vaccine for now. Gets Pap smear with OB/GYN.      Congenital ptosis of both eyelids   Has had levator palpebrae surgery in the past Has persistent right eye ptosis Used to be followed by Dr. Faylene Hoots      Hyperlipidemia   Last lipid profile reviewed Check lipid profile Continue to follow low-cholesterol diet      Relevant Orders   Lipid Profile   Other Visit Diagnoses       Vitamin D  deficiency       Relevant Orders   Vitamin D  (25 hydroxy)     Hyperglycemia       Relevant Orders   CMP14+EGFR   Hemoglobin A1c         Outpatient Encounter Medications as of 05/18/2023  Medication Sig   calcium elemental as carbonate (BARIATRIC TUMS ULTRA) 400 MG chewable tablet Chew 1,000 mg by mouth daily as needed for heartburn.   cycloSPORINE  (RESTASIS ) 0.05 %  ophthalmic emulsion Place 1 drop into both eyes 2 (two) times daily.   EPINEPHrine  (EPIPEN  2-PAK) 0.3 mg/0.3 mL IJ SOAJ injection Inject 0.3 mg into the muscle as needed for anaphylaxis.   triamcinolone  ointment (KENALOG ) 0.1 % Apply 1 Application topically 2 (two) times daily. Use for 1-2 weeks at a time during flares. Ok to mix with a moisturizer to make it last longer and decrease the possible side effects of topical steroids.   [DISCONTINUED] ibuprofen (ADVIL) 200 MG tablet Take 400 mg by mouth daily as needed for headache or moderate pain (pain score 4-6).   [DISCONTINUED] Cholecalciferol (VITAMIN D3) 5000 units CAPS Take 5,000 Units by mouth daily. (Patient not taking: Reported on 01/04/2023)   [DISCONTINUED] hydrOXYzine  (VISTARIL ) 25 MG capsule Take 1 capsule (25 mg total) by mouth every 12 (twelve) hours as needed.   [DISCONTINUED] Multiple Vitamin (MULTIVITAMIN) tablet Take 1 tablet by mouth daily. (Patient not taking: Reported on 01/04/2023)   [DISCONTINUED] triamcinolone  (KENALOG ) 0.1 % paste Use as directed 1 application  in the mouth or throat daily as needed (mouth ulcers). (Patient not taking: Reported on 01/04/2023)   No facility-administered encounter medications on file as of 05/18/2023.    Follow-up: Return in about 1 year (around 05/17/2024).   Meldon Sport, MD

## 2023-05-18 NOTE — Assessment & Plan Note (Addendum)
 Last lipid profile reviewed Check lipid profile Continue to follow low-cholesterol diet

## 2023-05-22 ENCOUNTER — Encounter: Payer: Self-pay | Admitting: Obstetrics & Gynecology

## 2023-05-22 ENCOUNTER — Ambulatory Visit: Payer: Commercial Managed Care - PPO | Admitting: Obstetrics & Gynecology

## 2023-05-22 ENCOUNTER — Other Ambulatory Visit (HOSPITAL_COMMUNITY)
Admission: RE | Admit: 2023-05-22 | Discharge: 2023-05-22 | Disposition: A | Discharge status: Home / Self Care | Source: Ambulatory Visit | Attending: Obstetrics & Gynecology | Admitting: Obstetrics & Gynecology

## 2023-05-22 VITALS — BP 106/63 | HR 72 | Ht 63.0 in | Wt 156.0 lb

## 2023-05-22 DIAGNOSIS — Z01419 Encounter for gynecological examination (general) (routine) without abnormal findings: Secondary | ICD-10-CM | POA: Insufficient documentation

## 2023-05-22 DIAGNOSIS — Z1212 Encounter for screening for malignant neoplasm of rectum: Secondary | ICD-10-CM | POA: Diagnosis not present

## 2023-05-22 DIAGNOSIS — Z1211 Encounter for screening for malignant neoplasm of colon: Secondary | ICD-10-CM

## 2023-05-22 LAB — HEMOCCULT GUIAC POC 1CARD (OFFICE): Fecal Occult Blood, POC: NEGATIVE

## 2023-05-22 NOTE — Progress Notes (Signed)
 Subjective:     Shelby [name] is a 58 y.o. female here for a routine exam.  No LMP recorded. Patient is postmenopausal. G1P0010 Birth Control Method:  menopausal Menstrual Calendar(currently): amenorrhea  Current complaints: none.   Current acute medical issues:  none   Recent Gynecologic History No LMP recorded. Patient is postmenopausal. Last Pap: 2024,  + HPV 18/45-->normal colposcopy Last mammogram: 4/25,  normal  Past Medical History:  Diagnosis Date   Hematuria 09/16/2014   Papanicolaou smear of cervix with positive high risk human papilloma virus (HPV) test 05/11/2020   04/2020 will get colpo   URI (upper respiratory infection)    Urinary frequency 09/16/2014   UTI (lower urinary tract infection) 09/16/2014   Vaginal Pap smear, abnormal     Past Surgical History:  Procedure Laterality Date   BELPHAROPTOSIS REPAIR  1969   CERVIX LESION DESTRUCTION     COLONOSCOPY N/A 10/25/2017   multiple small and large-mouthed diverticula in sigmoid colon, external hemorrhoids on retroflexion that were small.    PTOSIS REPAIR Bilateral 08/03/2017   Procedure: INTERNAL PTOSIS REPAIR BILATERAL;  Surgeon: Karan Osgood, MD;  Location: [facility] OR;  Service: Ophthalmology;  Laterality: Bilateral;   THORACOTOMY  2005   TONSILLECTOMY     and adenoidectomy   vuvular repair      OB History     Gravida  1   Para      Term      Preterm      AB  1   Living  0      SAB  1   IAB      Ectopic      Multiple      Live Births  0           Social History   Socioeconomic History   Marital status: Married    Spouse name: Not on file   Number of children: Not on file   Years of education: Not on file   Highest education level: Not on file  Occupational History   Not on file  Tobacco Use   Smoking status: Former    Current packs/day: 1.00    Average packs/day: 1 pack/day for 8.0 years (8.0 ttl pk-yrs)    Types: Cigarettes   Smokeless tobacco: Never   Tobacco comments:     quit in 1996  Vaping Use   Vaping status: Never Used  Substance and Sexual Activity   Alcohol use: Yes    Comment: 3 ounces every 3 weeks   Drug use: No   Sexual activity: Yes    Birth control/protection: Post-menopausal  Other Topics Concern   Not on file  Social History Narrative   Not on file   Social Drivers of Health   Financial Resource Strain: Low Risk  (05/22/2023)   Overall Financial Resource Strain (CARDIA)    Difficulty of Paying Living Expenses: Not hard at all  Food Insecurity: No Food Insecurity (05/22/2023)   Hunger Vital Sign    Worried About Running Out of Food in the Last Year: Never true    Ran Out of Food in the Last Year: Never true  Transportation Needs: No Transportation Needs (05/22/2023)   PRAPARE - Administrator, Civil Service (Medical): No    Lack of Transportation (Non-Medical): No  Physical Activity: Sufficiently Active (05/22/2023)   Exercise Vital Sign    Days of Exercise per Week: 5 days    Minutes of Exercise per Session: 40  min  Stress: No Stress Concern Present (05/22/2023)   [employer] of Occupational Health - Occupational Stress Questionnaire    Feeling of Stress : Only a little  Social Connections: Socially Integrated (05/22/2023)   Social Connection and Isolation Panel [NHANES]    Frequency of Communication with Friends and Family: Three times a week    Frequency of Social Gatherings with Friends and Family: Once a week    Attends Religious Services: More than 4 times per year    Active Member of [employer] or Organizations: Yes    Attends Engineer, structural: 1 to 4 times per year    Marital Status: Married    Family History  Problem Relation Age of Onset   COPD Mother    Osteoporosis Mother    Hypertension Mother    Hypertension Father    Hypertension Sister    Anxiety disorder Sister    Stroke Maternal Grandmother    Hypertension Maternal Grandmother    Diabetes Maternal Grandmother    Cancer Maternal  Grandfather    Heart attack Paternal Grandmother    Kidney failure Paternal Grandfather      Current Outpatient Medications:    Calcium Carbonate Antacid (TUMS PO), Take by mouth., Disp: , Rfl:    Cetirizine HCl (ZYRTEC PO), Take by mouth., Disp: , Rfl:    cycloSPORINE  (RESTASIS ) 0.05 % ophthalmic emulsion, Place 1 drop into both eyes 2 (two) times daily., Disp: 180 each, Rfl: 12   EPINEPHrine  (EPIPEN  2-PAK) 0.3 mg/0.3 mL IJ SOAJ injection, Inject 0.3 mg into the muscle as needed for anaphylaxis., Disp: 1 each, Rfl: 3   ibuprofen (ADVIL) 200 MG tablet, Take 200 mg by mouth every 6 (six) hours as needed., Disp: , Rfl:    triamcinolone  ointment (KENALOG ) 0.1 %, Apply 1 Application topically 2 (two) times daily. Use for 1-2 weeks at a time during flares. Ok to mix with a moisturizer to make it last longer and decrease the possible side effects of topical steroids., Disp: 454 g, Rfl: 1  Review of Systems  Review of Systems  Constitutional: Negative for fever, chills, weight loss, malaise/fatigue and diaphoresis.  HENT: Negative for hearing loss, ear pain, nosebleeds, congestion, sore throat, neck pain, tinnitus and ear discharge.   Eyes: Negative for blurred vision, double vision, photophobia, pain, discharge and redness.  Respiratory: Negative for cough, hemoptysis, sputum production, shortness of breath, wheezing and stridor.   Cardiovascular: Negative for chest pain, palpitations, orthopnea, claudication, leg swelling and PND.  Gastrointestinal: negative for abdominal pain. Negative for heartburn, nausea, vomiting, diarrhea, constipation, blood in stool and melena.  Genitourinary: Negative for dysuria, urgency, frequency, hematuria and flank pain.  Musculoskeletal: Negative for myalgias, back pain, joint pain and falls.  Skin: Negative for itching and rash.  Neurological: Negative for dizziness, tingling, tremors, sensory change, speech change, focal weakness, seizures, loss of  consciousness, weakness and headaches.  Endo/Heme/Allergies: Negative for environmental allergies and polydipsia. Does not bruise/bleed easily.  Psychiatric/Behavioral: Negative for depression, suicidal ideas, hallucinations, memory loss and substance abuse. The patient is not nervous/anxious and does not have insomnia.        Objective:  Blood pressure 106/63, pulse 72, height 5\' 3"  (1.6 m), weight 156 lb (70.8 kg).   Physical Exam  Vitals reviewed. Constitutional: She is oriented to person, place, and time. She appears well-developed and well-nourished.  HENT:  Head: Normocephalic and atraumatic.        Right Ear: External ear normal.  Left Ear: External ear  normal.  Nose: Nose normal.  Mouth/Throat: Oropharynx is clear and moist.  Eyes: Conjunctivae and EOM are normal. Pupils are equal, round, and reactive to light. Right eye exhibits no discharge. Left eye exhibits no discharge. No scleral icterus.  Neck: Normal range of motion. Neck supple. No tracheal deviation present. No thyromegaly present.  Cardiovascular: Normal rate, regular rhythm, normal heart sounds and intact distal pulses.  Exam reveals no gallop and no friction rub.   No murmur heard. Respiratory: Effort normal and breath sounds normal. No respiratory distress. She has no wheezes. She has no rales. She exhibits no tenderness.  GI: Soft. Bowel sounds are normal. She exhibits no distension and no mass. There is no tenderness. There is no rebound and no guarding.  Genitourinary:  Breasts no masses skin changes or nipple changes bilaterally      Vulva is normal without lesions Vagina is pink moist without discharge Cervix normal in appearance and pap is done Uterus is normal size shape and contour Adnexa is negative with normal sized ovaries  {Rectal    hemoccult negative, normal tone, no masses  Musculoskeletal: Normal range of motion. She exhibits no edema and no tenderness.  Neurological: She is alert and oriented to  person, place, and time. She has normal reflexes. She displays normal reflexes. No cranial nerve deficit. She exhibits normal muscle tone. Coordination normal.  Skin: Skin is warm and dry. No rash noted. No erythema. No pallor.  Psychiatric: She has a normal mood and affect. Her behavior is normal. Judgment and thought content normal.       Medications Ordered at today's visit: No orders of the defined types were placed in this encounter.   Other orders placed at today's visit: Orders Placed This Encounter  Procedures   POCT occult blood stool     ASSESSMENT + PLAN:    ICD-10-CM   1. Well woman exam with routine gynecological exam  Z01.419     2. Encounter for gynecological examination with Papanicolaou smear of cervix  Z01.419 Cytology - PAP( [place])    3. Screening for colorectal cancer  Z12.11 POCT occult blood stool   Z12.12           No follow-ups on file.

## 2023-05-25 ENCOUNTER — Other Ambulatory Visit: Payer: Self-pay

## 2023-05-28 ENCOUNTER — Encounter: Payer: Self-pay | Admitting: Obstetrics & Gynecology

## 2023-05-28 LAB — CYTOLOGY - PAP
Comment: NEGATIVE
Diagnosis: NEGATIVE
High risk HPV: NEGATIVE

## 2023-05-29 ENCOUNTER — Other Ambulatory Visit (HOSPITAL_COMMUNITY)
Admission: RE | Admit: 2023-05-29 | Discharge: 2023-05-29 | Disposition: A | Discharge status: Home / Self Care | Source: Ambulatory Visit | Attending: Internal Medicine | Admitting: Internal Medicine

## 2023-05-29 ENCOUNTER — Encounter: Payer: Self-pay | Admitting: Internal Medicine

## 2023-05-29 DIAGNOSIS — E785 Hyperlipidemia, unspecified: Secondary | ICD-10-CM | POA: Diagnosis not present

## 2023-05-29 DIAGNOSIS — L508 Other urticaria: Secondary | ICD-10-CM | POA: Diagnosis not present

## 2023-05-29 DIAGNOSIS — R739 Hyperglycemia, unspecified: Secondary | ICD-10-CM | POA: Insufficient documentation

## 2023-05-29 DIAGNOSIS — E559 Vitamin D deficiency, unspecified: Secondary | ICD-10-CM | POA: Diagnosis not present

## 2023-05-29 LAB — CBC WITH DIFFERENTIAL/PLATELET
Abs Immature Granulocytes: 0.01 10*3/uL (ref 0.00–0.07)
Basophils Absolute: 0 10*3/uL (ref 0.0–0.1)
Basophils Relative: 1 %
Eosinophils Absolute: 0.2 10*3/uL (ref 0.0–0.5)
Eosinophils Relative: 4 %
HCT: 44.7 % (ref 36.0–46.0)
Hemoglobin: 14.7 g/dL (ref 12.0–15.0)
Immature Granulocytes: 0 %
Lymphocytes Relative: 33 %
Lymphs Abs: 1.6 10*3/uL (ref 0.7–4.0)
MCH: 30.6 pg (ref 26.0–34.0)
MCHC: 32.9 g/dL (ref 30.0–36.0)
MCV: 92.9 fL (ref 80.0–100.0)
Monocytes Absolute: 0.4 10*3/uL (ref 0.1–1.0)
Monocytes Relative: 8 %
Neutro Abs: 2.6 10*3/uL (ref 1.7–7.7)
Neutrophils Relative %: 54 %
Platelets: 192 10*3/uL (ref 150–400)
RBC: 4.81 MIL/uL (ref 3.87–5.11)
RDW: 12.7 % (ref 11.5–15.5)
WBC: 4.8 10*3/uL (ref 4.0–10.5)
nRBC: 0 % (ref 0.0–0.2)

## 2023-05-29 LAB — HEMOGLOBIN A1C
Hgb A1c MFr Bld: 4.7 % — ABNORMAL LOW (ref 4.8–5.6)
Mean Plasma Glucose: 88.19 mg/dL

## 2023-05-29 LAB — LIPID PANEL
Cholesterol: 171 mg/dL (ref 0–200)
HDL: 75 mg/dL (ref >40)
LDL Cholesterol: 86 mg/dL (ref 0–99)
Total CHOL/HDL Ratio: 2.3 ratio
Triglycerides: 48 mg/dL (ref <150)
VLDL: 10 mg/dL (ref 0–40)

## 2023-05-29 LAB — COMPREHENSIVE METABOLIC PANEL WITH GFR
ALT: 15 U/L (ref 0–44)
AST: 22 U/L (ref 15–41)
Albumin: 4.3 g/dL (ref 3.5–5.0)
Alkaline Phosphatase: 82 U/L (ref 38–126)
Anion gap: 10 (ref 5–15)
BUN: 12 mg/dL (ref 6–20)
CO2: 24 mmol/L (ref 22–32)
Calcium: 9.3 mg/dL (ref 8.9–10.3)
Chloride: 98 mmol/L (ref 98–111)
Creatinine, Ser: 0.81 mg/dL (ref 0.44–1.00)
GFR, Estimated: >60 mL/min (ref >60)
Glucose, Bld: 93 mg/dL (ref 70–99)
Potassium: 3.9 mmol/L (ref 3.5–5.1)
Sodium: 132 mmol/L — ABNORMAL LOW (ref 135–145)
Total Bilirubin: 0.6 mg/dL (ref 0.0–1.2)
Total Protein: 7 g/dL (ref 6.5–8.1)

## 2023-05-29 LAB — TSH: TSH: 2.213 u[IU]/mL (ref 0.350–4.500)

## 2023-05-29 LAB — VITAMIN D 25 HYDROXY (VIT D DEFICIENCY, FRACTURES): Vit D, 25-Hydroxy: 41.15 ng/mL (ref 30–100)

## 2023-06-16 ENCOUNTER — Encounter: Payer: Self-pay | Admitting: Internal Medicine
